# Patient Record
Sex: Female | Born: 1965 | Race: Black or African American | Hispanic: No | Marital: Married | Smoking: Never smoker
Health system: Southern US, Community
[De-identification: ages and names within clinical notes are randomized; demographics above are authoritative.]

## PROBLEM LIST (undated history)

## (undated) DIAGNOSIS — T7840XA Allergy, unspecified, initial encounter: Secondary | ICD-10-CM

## (undated) DIAGNOSIS — I456 Pre-excitation syndrome: Secondary | ICD-10-CM

## (undated) DIAGNOSIS — R002 Palpitations: Secondary | ICD-10-CM

## (undated) DIAGNOSIS — K219 Gastro-esophageal reflux disease without esophagitis: Secondary | ICD-10-CM

## (undated) DIAGNOSIS — D649 Anemia, unspecified: Secondary | ICD-10-CM

## (undated) HISTORY — PX: TUBAL LIGATION: SHX77

## (undated) HISTORY — DX: Pre-excitation syndrome: I45.6

## (undated) HISTORY — PX: LEEP: SHX91

## (undated) HISTORY — DX: Anemia, unspecified: D64.9

## (undated) HISTORY — DX: Palpitations: R00.2

## (undated) HISTORY — DX: Gastro-esophageal reflux disease without esophagitis: K21.9

## (undated) HISTORY — DX: Allergy, unspecified, initial encounter: T78.40XA

## (undated) HISTORY — PX: OTHER SURGICAL HISTORY: SHX169

---

## 1986-04-30 HISTORY — PX: APPENDECTOMY: SHX54

## 2001-03-11 ENCOUNTER — Ambulatory Visit (HOSPITAL_COMMUNITY): Admission: RE | Admit: 2001-03-11 | Discharge: 2001-03-11 | Payer: Self-pay | Admitting: *Deleted

## 2002-12-21 ENCOUNTER — Ambulatory Visit (HOSPITAL_COMMUNITY): Admission: RE | Admit: 2002-12-21 | Discharge: 2002-12-21 | Payer: Self-pay | Admitting: *Deleted

## 2003-02-05 ENCOUNTER — Ambulatory Visit (HOSPITAL_COMMUNITY): Admission: RE | Admit: 2003-02-05 | Discharge: 2003-02-05 | Payer: Self-pay | Admitting: Internal Medicine

## 2006-03-27 ENCOUNTER — Ambulatory Visit: Payer: Self-pay | Admitting: Internal Medicine

## 2006-05-20 ENCOUNTER — Ambulatory Visit: Payer: Self-pay

## 2006-05-27 ENCOUNTER — Ambulatory Visit: Payer: Self-pay

## 2012-11-06 ENCOUNTER — Encounter: Payer: Self-pay | Admitting: Internal Medicine

## 2012-11-11 ENCOUNTER — Encounter: Payer: Self-pay | Admitting: Internal Medicine

## 2012-11-11 ENCOUNTER — Encounter: Payer: Self-pay | Admitting: *Deleted

## 2012-11-11 ENCOUNTER — Ambulatory Visit (INDEPENDENT_AMBULATORY_CARE_PROVIDER_SITE_OTHER): Payer: 59 | Admitting: Internal Medicine

## 2012-11-11 VITALS — BP 104/70 | HR 73 | Ht 66.5 in | Wt 264.6 lb

## 2012-11-11 DIAGNOSIS — R002 Palpitations: Secondary | ICD-10-CM | POA: Insufficient documentation

## 2012-11-11 DIAGNOSIS — R0600 Dyspnea, unspecified: Secondary | ICD-10-CM

## 2012-11-11 DIAGNOSIS — I456 Pre-excitation syndrome: Secondary | ICD-10-CM

## 2012-11-11 DIAGNOSIS — R0602 Shortness of breath: Secondary | ICD-10-CM

## 2012-11-11 LAB — CBC WITH DIFFERENTIAL/PLATELET
Basophils Relative: 0.5 % (ref 0.0–3.0)
Eosinophils Absolute: 0.2 10*3/uL (ref 0.0–0.7)
HCT: 37.3 % (ref 36.0–46.0)
Hemoglobin: 12.2 g/dL (ref 12.0–15.0)
Lymphs Abs: 1.8 10*3/uL (ref 0.7–4.0)
MCHC: 32.6 g/dL (ref 30.0–36.0)
MCV: 79.3 fl (ref 78.0–100.0)
Monocytes Absolute: 0.5 10*3/uL (ref 0.1–1.0)
Neutro Abs: 5.1 10*3/uL (ref 1.4–7.7)
RBC: 4.71 Mil/uL (ref 3.87–5.11)

## 2012-11-11 LAB — BASIC METABOLIC PANEL
CO2: 26 mEq/L (ref 19–32)
Chloride: 107 mEq/L (ref 96–112)
Glucose, Bld: 103 mg/dL — ABNORMAL HIGH (ref 70–99)
Sodium: 139 mEq/L (ref 135–145)

## 2012-11-11 NOTE — Assessment & Plan Note (Signed)
The patient does have palpitations. She is a difficult historian however. It is not clear to me that her symptoms are caused by worsening SVT or perhaps atrial fibrillation. After additional evaluation, it is my anticipation that she will undergo  48 hour Holter monitoring. I have not restarted her antiarrhythmic medications at this time she was intolerant to these for the most part.

## 2012-11-11 NOTE — Patient Instructions (Signed)
Your physician recommends that you schedule a follow-up appointment in: 2 months with Dr Ladona Ridgel  Your physician has requested that you have an echocardiogram. Echocardiography is a painless test that uses sound waves to create images of your heart. It provides your doctor with information about the size and shape of your heart and how well your heart's chambers and valves are working. This procedure takes approximately one hour. There are no restrictions for this procedure.   Your physician recommends that you return for lab work today BMP/TSH/CBC

## 2012-11-11 NOTE — Assessment & Plan Note (Signed)
Along with dyspnea, she has fatigue and may legs. We will obtain blood work today including basic metabolic profile, a CBC,, and thyroid function studies.

## 2012-11-11 NOTE — Progress Notes (Signed)
HPI Ms. Tolen returns today for followup. She has not been seen in our arrhythmia clinic for 10 years. 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 Her pathway conduction was fairly poor and was not thought to be dangerous. Because of the location of the pathway and because of her relatively young age, it was deemed most appropriate not to proceed with catheter ablation. In the interim, she has been stable. Over the last several months however the patient has had fatigue, weakness, palpitations, and overall may legs. She notes some peripheral edema, PND, and orthopnea. She has tingling and numbness in her lower extremities, particularly when she sits for a long time. She has gained 40 pounds in the last 10 years. She denies frank syncope. She is on no medical therapy. She thinks she is going through menopause. Allergies  Allergen Reactions  . Penicillins     "Severe reaction as a child"     No current outpatient prescriptions on file.   No current facility-administered medications for this visit.     History reviewed. No pertinent past medical history.  ROS:   All systems reviewed and negative except as noted in the HPI.   History reviewed. No pertinent past surgical history.   No family history on file.   History   Social History  . Marital Status: Single    Spouse Name: N/A    Number of Children: N/A  . Years of Education: N/A   Occupational History  . Not on file.   Social History Main Topics  . Smoking status: Never Smoker   . Smokeless tobacco: Not on file  . Alcohol Use: 1.0 oz/week    2 drink(s) per week  . Drug Use: No  . Sexually Active: Yes -- Female partner(s)   Other Topics Concern  . Not on file   Social History Narrative  . No narrative on file     BP 104/70  Pulse 73  Ht 5' 6.5" (1.689 m)  Wt 264 lb 9.6 oz (120.022 kg)  BMI  42.07 kg/m2  Physical Exam:  Well appearing obese, middle-age woman, NAD HEENT: Unremarkable Neck:  7 cm JVD, no thyromegally Lungs:  Clear with no wheezes, rales, or rhonchi. HEART:  Regular rate rhythm, no murmurs, no rubs, no clicks Abd:  soft, obese, positive bowel sounds, no organomegally, no rebound, no guarding Ext:  2 plus pulses, no edema, no cyanosis, no clubbing Skin:  No rashes no nodules Neuro:  CN II through XII intact, motor grossly intact  EKG - normal sinus rhythm with sinus arrhythmia and occasional PVC. No manifest preexcitation is present on ECG today.   Assess/Plan:

## 2012-11-19 ENCOUNTER — Encounter: Payer: Self-pay | Admitting: Internal Medicine

## 2012-11-21 ENCOUNTER — Telehealth: Payer: Self-pay | Admitting: Internal Medicine

## 2012-11-21 NOTE — Telephone Encounter (Signed)
Follow up  Pt states she is returning a call regarding her labs.

## 2012-11-21 NOTE — Telephone Encounter (Signed)
Spoke with patient who states she had a call from [facility] yesterday and no message was left.  I advised patient that lab results have not been reviewed by Dr. Ladona Ridgel but I reported preliminary results.  Patient verbalized understanding and is aware of her ECHO appointment 7/29.

## 2012-11-25 ENCOUNTER — Ambulatory Visit (HOSPITAL_COMMUNITY): Payer: 59 | Attending: Internal Medicine

## 2012-11-25 DIAGNOSIS — R0989 Other specified symptoms and signs involving the circulatory and respiratory systems: Secondary | ICD-10-CM | POA: Insufficient documentation

## 2012-11-25 DIAGNOSIS — R002 Palpitations: Secondary | ICD-10-CM | POA: Insufficient documentation

## 2012-11-25 DIAGNOSIS — R0602 Shortness of breath: Secondary | ICD-10-CM

## 2012-11-25 DIAGNOSIS — I456 Pre-excitation syndrome: Secondary | ICD-10-CM

## 2012-11-25 DIAGNOSIS — R5381 Other malaise: Secondary | ICD-10-CM | POA: Insufficient documentation

## 2012-11-25 DIAGNOSIS — R609 Edema, unspecified: Secondary | ICD-10-CM | POA: Insufficient documentation

## 2012-11-25 DIAGNOSIS — R0609 Other forms of dyspnea: Secondary | ICD-10-CM | POA: Insufficient documentation

## 2012-11-25 NOTE — Progress Notes (Signed)
Echocardiogram performed.  

## 2012-11-28 NOTE — Telephone Encounter (Signed)
I have tried to call patient in regards to her normal echo.  Her number is d/c'd.  If she is still having palpitations we will order a monitor to assess prior to her follow up

## 2012-12-11 ENCOUNTER — Telehealth: Payer: Self-pay | Admitting: Internal Medicine

## 2012-12-11 NOTE — Telephone Encounter (Signed)
Patient is aware 

## 2012-12-11 NOTE — Telephone Encounter (Signed)
New Prob  Pt is calling wanting the results of her echo.

## 2013-01-15 ENCOUNTER — Ambulatory Visit (INDEPENDENT_AMBULATORY_CARE_PROVIDER_SITE_OTHER): Payer: 59 | Admitting: Internal Medicine

## 2013-01-15 ENCOUNTER — Encounter: Payer: Self-pay | Admitting: Internal Medicine

## 2013-01-15 VITALS — BP 120/80 | HR 66 | Ht 66.0 in | Wt 261.0 lb

## 2013-01-15 DIAGNOSIS — R002 Palpitations: Secondary | ICD-10-CM

## 2013-01-15 MED ORDER — FLECAINIDE ACETATE 150 MG PO TABS: 150.0000 mg | ORAL_TABLET | ORAL | Status: DC | PRN

## 2013-01-15 NOTE — Patient Instructions (Addendum)
Your physician has recommended you make the following change in your medication:   1. Start Flecainide 150 mg as needed for Palpitations. If Palpitations continue Repeat once in 24hrs  Your physician recommends that you schedule a follow-up appointment in: 3 months with Dr. Ladona Ridgel

## 2013-01-15 NOTE — Progress Notes (Signed)
HPI Paige Oneill returns today for followup. 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 At the time of her EP study 10 years ago, we decided not to proceed with catheter ablation out of concerns for developing complete heart block. She did well for many years and then she re-presented several months ago with increased palpitations. Since then, the patient has been stable but does continue to have palpitations though her symptoms have improved. She has tried a reduced caffeine in her diet. We initially discussed having her wear a cardiac monitor, but because her symptoms are so infrequent and variable, it was deemed worth waiting. Allergies  Allergen Reactions  . Penicillins     "Severe reaction as a child"     Current Outpatient Prescriptions  Medication Sig Dispense Refill  . flecainide (TAMBOCOR) 150 MG tablet Take 1 tablet (150 mg total) by mouth as needed. 1 tablet as needed for Palpitation. If Palpitations continue  Repeat once in 24hrs.  30 tablet  1   No current facility-administered medications for this visit.     History reviewed. No pertinent past medical history.  ROS:   All systems reviewed and negative except as noted in the HPI.   History reviewed. No pertinent past surgical history.   No family history on file.   History   Social History  . Marital Status: Single    Spouse Name: N/A    Number of Children: N/A  . Years of Education: N/A   Occupational History  . Not on file.   Social History Main Topics  . Smoking status: Never Smoker   . Smokeless tobacco: Not on file  . Alcohol Use: 1.0 oz/week    2 drink(s) per week  . Drug Use: No  . Sexual Activity: Yes    Partners: Male   Other Topics Concern  . Not on file   Social History Narrative  . No narrative on file     BP 120/80  Pulse 66  Ht 5\' 6"  (1.676 m)  Wt 261 lb (118.389 kg)  BMI  42.15 kg/m2  Physical Exam:  Obese but Well appearing middle-age woman,NAD HEENT: Unremarkable Neck:  6 cm JVD, no thyromegally Back:  No CVA tenderness Lungs:  Clear with no wheezes, rales, or rhonchi. HEART:  Regular rate rhythm, no murmurs, no rubs, no clicks Abd:  soft, positive bowel sounds, no organomegally, no rebound, no guarding Ext:  2 plus pulses, no edema, no cyanosis, no clubbing Skin:  No rashes no nodules Neuro:  CN II through XII intact, motor grossly intact   Assess/Plan:

## 2013-01-15 NOTE — Assessment & Plan Note (Signed)
We discussed the various treatment options in detail today. I've recommended that she try flecainide 150 mg taken as needed, on days that she is having increased palpitations. If she requires taking this medication several times a week, she is instructed to call us. Otherwise all see her back in several months.

## 2013-03-18 ENCOUNTER — Ambulatory Visit (INDEPENDENT_AMBULATORY_CARE_PROVIDER_SITE_OTHER): Payer: 59 | Admitting: Internal Medicine

## 2013-03-18 ENCOUNTER — Encounter: Payer: Self-pay | Admitting: Internal Medicine

## 2013-03-18 ENCOUNTER — Encounter: Payer: Self-pay | Admitting: *Deleted

## 2013-03-18 VITALS — BP 139/91 | HR 62 | Ht 66.5 in | Wt 263.0 lb

## 2013-03-18 DIAGNOSIS — I456 Pre-excitation syndrome: Secondary | ICD-10-CM

## 2013-03-18 DIAGNOSIS — R002 Palpitations: Secondary | ICD-10-CM

## 2013-03-18 NOTE — Patient Instructions (Signed)
Your physician wants you to follow-up in: 6 months with Dr. Taylor. You will receive a reminder letter in the mail two months in advance. If you don't receive a letter, please call our office to schedule the follow-up appointment.  Your physician recommends that you continue on your current medications as directed. Please refer to the Current Medication list given to you today.  

## 2013-03-18 NOTE — Assessment & Plan Note (Signed)
Her symptoms are fairly well controlled. She is taking flecainide as needed about twice a week. We discussed taking this medication more frequently as her symptoms necessitate.

## 2013-03-18 NOTE — Progress Notes (Signed)
HPI Paige Oneill returns today for followup. 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 At the time of her EP study 10 years ago, we decided not to proceed with catheter ablation out of concerns for developing complete heart block. She did well for many years and then she re-presented several months ago with increased palpitations. Since then, the patient has been stable but does continue to have palpitations though her symptoms have improved on flecainide. She has tried a reduced caffeine in her diet. She is frustrated about feeling tired and gaining weight. She is active at work but typically does not do much exercise. Allergies  Allergen Reactions  . Penicillins     "Severe reaction as a child"     Current Outpatient Prescriptions  Medication Sig Dispense Refill  . flecainide (TAMBOCOR) 150 MG tablet Take 1 tablet (150 mg total) by mouth as needed. 1 tablet as needed for Palpitation. If Palpitations continue  Repeat once in 24hrs.  30 tablet  1   No current facility-administered medications for this visit.     History reviewed. No pertinent past medical history.  ROS:   All systems reviewed and negative except as noted in the HPI.   History reviewed. No pertinent past surgical history.   No family history on file.   History   Social History  . Marital Status: Single    Spouse Name: N/A    Number of Children: N/A  . Years of Education: N/A   Occupational History  . Not on file.   Social History Main Topics  . Smoking status: Never Smoker   . Smokeless tobacco: Not on file  . Alcohol Use: 1.0 oz/week    2 drink(s) per week  . Drug Use: No  . Sexual Activity: Yes    Partners: Male   Other Topics Concern  . Not on file   Social History Narrative  . No narrative on file     BP 139/91  Pulse 62  Ht 5' 6.5" (1.689 m)  Wt 263 lb (119.296 kg)  BMI 41.82  kg/m2  Physical Exam:  Obese but Well appearing middle-age woman,NAD HEENT: Unremarkable Neck:  6 cm JVD, no thyromegally Back:  No CVA tenderness Lungs:  Clear with no wheezes, rales, or rhonchi. HEART:  Regular rate rhythm, no murmurs, no rubs, no clicks Abd:  soft, positive bowel sounds, no organomegally, no rebound, no guarding Ext:  2 plus pulses, no edema, no cyanosis, no clubbing Skin:  No rashes no nodules Neuro:  CN II through XII intact, motor grossly intact   Assess/Plan:

## 2013-03-18 NOTE — Assessment & Plan Note (Signed)
We discussed the importance of weight loss, specifically exercising more. She will try to be more active.

## 2013-09-14 ENCOUNTER — Ambulatory Visit: Payer: 59 | Admitting: Internal Medicine

## 2013-09-24 ENCOUNTER — Encounter: Payer: Self-pay | Admitting: Internal Medicine

## 2014-08-24 ENCOUNTER — Ambulatory Visit (INDEPENDENT_AMBULATORY_CARE_PROVIDER_SITE_OTHER): Payer: 59 | Admitting: Internal Medicine

## 2014-08-24 ENCOUNTER — Encounter: Payer: Self-pay | Admitting: *Deleted

## 2014-08-24 ENCOUNTER — Encounter: Payer: Self-pay | Admitting: Internal Medicine

## 2014-08-24 VITALS — BP 130/84 | HR 70 | Ht 66.5 in | Wt 262.8 lb

## 2014-08-24 DIAGNOSIS — I456 Pre-excitation syndrome: Secondary | ICD-10-CM

## 2014-08-24 NOTE — Assessment & Plan Note (Signed)
She is frustrated by her inability to lose weight. I've encouraged the patient to increase her physical activity and eat less.

## 2014-08-24 NOTE — Patient Instructions (Signed)

## 2014-08-24 NOTE — Assessment & Plan Note (Signed)
She is minimally symptomatic and has no evidence of ventricular preexcitation on her ECG today. She will continue her current medical therapy. She is encouraged to maintain a low caffeine, low alcohol diet.

## 2014-08-24 NOTE — Progress Notes (Signed)
HPI [name] returns today for followup. 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 At the time of her EP study 12 years ago, we decided not to proceed with catheter ablation out of concerns for developing complete heart block. She did well for many years and then she re-presented over a year ago with increased palpitations. Since then, the patient has been stable but does continue to have palpitations though her symptoms have improved on flecainide. She has tried a reduced caffeine in her diet. She is frustrated about feeling tired and gaining weight. She has had minimal palpitations, and is only taking her flecainide on an as-needed basis, typically once or twice a month. Allergies  Allergen Reactions  . Penicillins     "Severe reaction as a child"     Current Outpatient Prescriptions  Medication Sig Dispense Refill  . flecainide (TAMBOCOR) 150 MG tablet Take 150 mg by mouth daily as needed (Palpitations). Can repeat once in 24 hours if palpitations continue     No current facility-administered medications for this visit.     No past medical history on file.  ROS:   All systems reviewed and negative except as noted in the HPI.   No past surgical history on file.   Family History  Problem Relation Age of Onset  . Hypertension Mother   . Diabetes Mother   . Aneurysm Mother   . Cancer Mother   . Cancer Maternal Grandmother   . Heart murmur Daughter      History   Social History  . Marital Status: Single    Spouse Name: N/A  . Number of Children: N/A  . Years of Education: N/A   Occupational History  . Not on file.   Social History Main Topics  . Smoking status: Never Smoker   . Smokeless tobacco: Not on file  . Alcohol Use: 1.0 oz/week    2 Standard drinks or equivalent per week  . Drug Use: No  . Sexual Activity:    Partners: Male   Other Topics Concern  .  Not on file   Social History Narrative     BP 130/84 mmHg  Pulse 70  Ht 5' 6.5" (1.689 m)  Wt 262 lb 12.8 oz (119.205 kg)  BMI 41.79 kg/m2  Physical Exam:  Obese but Well appearing middle-age woman,NAD HEENT: Unremarkable Neck:  6 cm JVD, no thyromegally Back:  No CVA tenderness Lungs:  Clear with no wheezes, rales, or rhonchi. HEART:  Regular rate rhythm, no murmurs, no rubs, no clicks Abd:  soft, positive bowel sounds, no organomegally, no rebound, no guarding Ext:  2 plus pulses, no edema, no cyanosis, no clubbing Skin:  No rashes no nodules Neuro:  CN II through XII intact, motor grossly intact  ECG - normal sinus rhythm with normal axis and intervals. There was no ventricular preexcitation. Assess/Plan:

## 2016-11-16 ENCOUNTER — Encounter: Payer: Self-pay | Admitting: Gastroenterology

## 2016-11-29 ENCOUNTER — Telehealth: Payer: Self-pay

## 2016-11-29 NOTE — Telephone Encounter (Signed)
Mother, Paige Oneill came in to see Paige Oneill on 11-29-16. She was under the impression that she and her daughter, Paige Oneill were scheduled for colons on the same day: Sept 21, 2018.  Her mother had not been scheduled prior to her appt with Amy b/c she is on anti coagulant, Plavix.  We scheduled her and her mother, Paige Oneill, with Dr. Russella DarStark on 02-11-17. I cancelled Paige Oneill's colon on 01/18/17 and called and let her know about the new date.  She confirmed understanding. She is scheduled for a Previsit on 01-04-17.

## 2017-01-04 ENCOUNTER — Ambulatory Visit (AMBULATORY_SURGERY_CENTER): Payer: Self-pay | Admitting: *Deleted

## 2017-01-04 VITALS — Ht 66.5 in | Wt 270.0 lb

## 2017-01-04 DIAGNOSIS — Z800 Family history of malignant neoplasm of digestive organs: Secondary | ICD-10-CM

## 2017-01-04 MED ORDER — NA SULFATE-K SULFATE-MG SULF 17.5-3.13-1.6 GM/177ML PO SOLN: 1.0000 | Freq: Once | ORAL | 0 refills | Status: AC

## 2017-01-04 NOTE — Progress Notes (Signed)
No egg or soy allergy known to patient  No issues with past sedation with any surgeries  or procedures, no intubation problems  No diet pills per patient No home 02 use per patient  No blood thinners per patient  Pt denies issues with constipation  No A fib or A flutter  EMMI video sent to pt's e mail pt declined   

## 2017-01-18 ENCOUNTER — Encounter: Payer: 59 | Admitting: Gastroenterology

## 2017-01-28 ENCOUNTER — Ambulatory Visit (INDEPENDENT_AMBULATORY_CARE_PROVIDER_SITE_OTHER): Payer: 59

## 2017-01-28 ENCOUNTER — Ambulatory Visit (INDEPENDENT_AMBULATORY_CARE_PROVIDER_SITE_OTHER): Payer: 59 | Admitting: Orthopaedic Surgery

## 2017-01-28 ENCOUNTER — Encounter: Payer: Self-pay | Admitting: Gastroenterology

## 2017-01-28 DIAGNOSIS — M25562 Pain in left knee: Secondary | ICD-10-CM

## 2017-01-28 DIAGNOSIS — M25572 Pain in left ankle and joints of left foot: Secondary | ICD-10-CM

## 2017-01-28 DIAGNOSIS — G8929 Other chronic pain: Secondary | ICD-10-CM | POA: Diagnosis not present

## 2017-01-28 MED ORDER — METHYLPREDNISOLONE 4 MG PO TABS: ORAL_TABLET | ORAL | 0 refills | Status: DC

## 2017-01-28 MED ORDER — MELOXICAM 7.5 MG PO TABS: 7.5000 mg | ORAL_TABLET | Freq: Two times a day (BID) | ORAL | 3 refills | Status: DC | PRN

## 2017-01-28 NOTE — Progress Notes (Signed)
Office Visit Note   Patient: Paige Oneill           Date of Birth: 15-Mar-1966           MRN: [id number] Visit Date: 01/28/2017              Requested by: No referring provider defined for this encounter. PCP: Patient, No Pcp Per   Assessment & Plan: Visit Diagnoses:  1. Chronic pain of left knee   2. Pain in left ankle and joints of left foot     Plan: I do feel her symptoms of her foot are related more plantar fasciitis and this may affect her knee the way she walks. I'll give her a note recommending no steal toed shoes or boots. We'll put her on a steroid taper as well as some topical anti-inflammatories and activity modification. I recommended heel inserts and formal physical therapy they could help with plantar fasciitis as well as her knee. I talked about quad training exercises and weight loss as well. Over this combination of activities will help her symptoms resolve. If not we may consider steroid injection. All questions concerns were answered and addressed. I'll have her come back and see [place] in 4 weeks.  Follow-Up Instructions: Return in about 4 weeks (around 02/25/2017).   Orders:  Orders Placed This Encounter  Procedures  . XR Knee 1-2 Views Left  . XR Foot Complete Left   Meds ordered this encounter  Medications  . methylPREDNISolone (MEDROL) 4 MG tablet    Sig: Medrol dose pack. Take as instructed    Dispense:  21 tablet    Refill:  0  . meloxicam (MOBIC) 7.5 MG tablet    Sig: Take 1 tablet (7.5 mg total) by mouth 2 (two) times daily between meals as needed for pain.    Dispense:  60 tablet    Refill:  3      Procedures: No procedures performed   Clinical Data: No additional findings.   Subjective: No chief complaint on file. The patient comes in with chief complaints of both left knee pain and left foot pain. Left foot is been worse and she points to the arch and the plantar aspect of her foot. 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 She is on her feet on concrete all day. She says the first step she takes in the morning is very painful and she's been sitting for a while and gets up it is painful. At times she feels like her knees giving way on her but she denies any locking catching. She gets pain going up and down stairs. She is moderately obese individual. She has not tried any significant inflammatory does not been through any therapy either. She denies any recent injuries.  HPI  Review of Systems She currently denies any headache, chest pain, shortness of breath, fever, chills, nausea, vomiting.  Objective: Vital Signs: There were no vitals taken for this visit.  Physical Exam She is alert or 3 and in no acute distress Ortho Exam Examination of her left knee shows fluid range of motion of that left knee. The patella tracks well. She has negative McMurray's and Lachman's exam. There is no effusion. Examination of her left foot shows pain over the heel over the plantar fascia consistent with plantar fasciitis. She's got good range of motion of her foot and its neurovascular intact. Specialty Comments:  No specialty comments  available.  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  Xr Knee 1-2 Views Left  Result Date: 01/28/2017 2 views left knee appear normal with no acute findings.    PMFS History: Patient Active Problem List   Diagnosis Date Noted  . Morbid obesity (HCC) 03/18/2013  . 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 Dyspnea 11/11/2012  . Palpitations 11/11/2012   Past Medical History:  Diagnosis Date  . Allergy   . Anemia   . GERD (gastroesophageal reflux disease)    occ  . Palpitations   . Wolff-Parkinson-White (WPW) syndrome     Family History  Problem Relation Age of Onset  . Hypertension Mother   . Diabetes  Mother   . Aneurysm Mother   . Cancer Mother   . Colon cancer Maternal Grandmother   . Heart murmur Daughter   . Colon cancer Brother 52       dx'd 6 months ago as of 01-04-17   . Liver cancer Brother        mets from colon   . Lung cancer Brother        mets from colon   . Thyroid cancer Maternal Grandfather   . Colon cancer Other   . Colon polyps Neg Hx   . Esophageal cancer Neg Hx     Past Surgical History:  Procedure Laterality Date  . APPENDECTOMY  1988  . LEEP    . NSVD     x3  . TUBAL LIGATION     Social History   Occupational History  . Not on file.   Social History Main Topics  . Smoking status: Never Smoker  . Smokeless tobacco: Never Used  . Alcohol use 1.0 oz/week    2 Standard drinks or equivalent per week     Comment: socially  . Drug use: No  . Sexual activity: Yes    Partners: Male

## 2017-02-11 ENCOUNTER — Encounter: Payer: Self-pay | Admitting: Gastroenterology

## 2017-02-11 ENCOUNTER — Ambulatory Visit (AMBULATORY_SURGERY_CENTER): Payer: 59 | Admitting: Gastroenterology

## 2017-02-11 VITALS — BP 126/62 | HR 62 | Temp 97.1°F | Resp 21 | Ht 66.0 in | Wt 270.0 lb

## 2017-02-11 DIAGNOSIS — Z800 Family history of malignant neoplasm of digestive organs: Secondary | ICD-10-CM | POA: Diagnosis not present

## 2017-02-11 DIAGNOSIS — Z1212 Encounter for screening for malignant neoplasm of rectum: Secondary | ICD-10-CM | POA: Diagnosis not present

## 2017-02-11 DIAGNOSIS — Z1211 Encounter for screening for malignant neoplasm of colon: Secondary | ICD-10-CM

## 2017-02-11 MED ORDER — SODIUM CHLORIDE 0.9 % IV SOLN: 500.0000 mL | INTRAVENOUS | Status: AC

## 2017-02-11 NOTE — Op Note (Addendum)
[facility] Patient Name: Paige Oneill Procedure Date: 02/11/2017 11:33 AM MRN: [id number] Endoscopist: Meryl Dare , MD Age: 51 Referring MD:  Date of Birth: Jun 04, 1965 Gender: Female Account #: [id number] Procedure:                Colonoscopy Indications:              Screening in patient at increased risk: Family                            history of 1st-degree relative with colorectal                            cancer before age 54 years Medicines:                Monitored Anesthesia Care Procedure:                Pre-Anesthesia Assessment:                           - Prior to the procedure, a History and Physical                            was performed, and patient medications and                            allergies were reviewed. The patient's tolerance of                            previous anesthesia was also reviewed. The risks                            and benefits of the procedure and the sedation                            options and risks were discussed with the patient.                            All questions were answered, and informed consent                            was obtained. Prior Anticoagulants: The patient has                            taken no previous anticoagulant or antiplatelet                            agents. ASA Grade Assessment: II - A patient with                            mild systemic disease. After reviewing the risks                            and benefits, the patient was deemed in  satisfactory condition to undergo the procedure.                           After obtaining informed consent, the colonoscope                            was passed under direct vision. Throughout the                            procedure, the patient's blood pressure, pulse, and                            oxygen saturations were monitored continuously. The                            Colonoscope was introduced through  the anus and                            advanced to the the cecum, identified by                            appendiceal orifice and ileocecal valve. The                            ileocecal valve, appendiceal orifice, and rectum                            were photographed. The quality of the bowel                            preparation was good. The colonoscopy was performed                            without difficulty. The patient tolerated the                            procedure well. Scope In: 11:43:26 AM Scope Out: 11:53:30 AM Scope Withdrawal Time: 0 hours 6 minutes 56 seconds  Total Procedure Duration: 0 hours 10 minutes 4 seconds  Findings:                 The perianal and digital rectal examinations were                            normal.                           A single small angiodysplastic lesion without                            bleeding was found in the cecum.                           Multiple medium-mouthed diverticula were found in  the left colon. There was no evidence of                            diverticular bleeding.                           The exam was otherwise without abnormality on                            direct and retroflexion views. Complications:            No immediate complications. Estimated blood loss:                            None. Estimated Blood Loss:     Estimated blood loss: none. Impression:               - A single small non-bleeding colonic                            angiodysplastic lesion.                           - Mild diverticulosis in the left colon. There was                            no evidence of diverticular bleeding.                           - The examination was otherwise normal on direct                            and retroflexion views.                           - No specimens collected. Recommendation:           - Repeat colonoscopy in 5 years for screening                             purposes.                           - Patient has a contact number available for                            emergencies. The signs and symptoms of potential                            delayed complications were discussed with the                            patient. Return to normal activities tomorrow.                            Written discharge instructions were provided to the  patient.                           - Continue present medications.                           - High fiber diet indefinitely. Meryl Dare, MD 02/11/2017 11:58:46 AM This report has been signed electronically.

## 2017-02-11 NOTE — Progress Notes (Signed)
Report given to PACU, vss 

## 2017-02-11 NOTE — Patient Instructions (Signed)
Discharge instructions given. Handout on diverticulosis. Resume previous medications. YOU HAD AN ENDOSCOPIC PROCEDURE TODAY AT THE [facility]:   Refer to the procedure report that was given to you for any specific questions about what was found during the examination.  If the procedure report does not answer your questions, please call your gastroenterologist to clarify.  If you requested that your care partner not be given the details of your procedure findings, then the procedure report has been included in a sealed envelope for you to review at your convenience later.  YOU SHOULD EXPECT: Some feelings of bloating in the abdomen. Passage of more gas than usual.  Walking can help get rid of the air that was put into your GI tract during the procedure and reduce the bloating. If you had a lower endoscopy (such as a colonoscopy or flexible sigmoidoscopy) you may notice spotting of blood in your stool or on the toilet paper. If you underwent a bowel prep for your procedure, you may not have a normal bowel movement for a few days.  Please Note:  You might notice some irritation and congestion in your nose or some drainage.  This is from the oxygen used during your procedure.  There is no need for concern and it should clear up in a day or so.  SYMPTOMS TO REPORT IMMEDIATELY:   Following lower endoscopy (colonoscopy or flexible sigmoidoscopy):  Excessive amounts of blood in the stool  Significant tenderness or worsening of abdominal pains  Swelling of the abdomen that is new, acute  Fever of 100F or higher   For urgent or emergent issues, a gastroenterologist can be reached at any hour by calling [phone].   DIET:  We do recommend a small meal at first, but then you may proceed to your regular diet.  Drink plenty of fluids but you should avoid alcoholic beverages for 24 hours.  ACTIVITY:  You should plan to take it easy for the rest of today and you should NOT DRIVE or use  heavy machinery until tomorrow (because of the sedation medicines used during the test).    FOLLOW UP: Our staff will call the number listed on your records the next business day following your procedure to check on you and address any questions or concerns that you may have regarding the information given to you following your procedure. If we do not reach you, we will leave a message.  However, if you are feeling well and you are not experiencing any problems, there is no need to return our call.  We will assume that you have returned to your regular daily activities without incident.  If any biopsies were taken you will be contacted by phone or by letter within the next 1-3 weeks.  Please call us at [phone] if you have not heard about the biopsies in 3 weeks.    SIGNATURES/CONFIDENTIALITY: You and/or your care partner have signed paperwork which will be entered into your electronic medical record.  These signatures attest to the fact that that the information above on your After Visit Summary has been reviewed and is understood.  Full responsibility of the confidentiality of this discharge information lies with you and/or your care-partner. 

## 2017-02-12 ENCOUNTER — Telehealth: Payer: Self-pay | Admitting: *Deleted

## 2017-02-12 NOTE — Telephone Encounter (Signed)
  Follow up Call-  Call back number 02/11/2017  Post procedure Call Back phone  # [phone]  Permission to leave phone message Yes  Some recent data might be hidden     Patient questions:  Do you have a fever, pain , or abdominal swelling? No. Pain Score  0 *  Have you tolerated food without any problems? Yes.    Have you been able to return to your normal activities? Yes.    Do you have any questions about your discharge instructions: Diet   No. Medications  No. Follow up visit  No.  Do you have questions or concerns about your Care? No.  Actions: * If pain score is 4 or above: No action needed, pain <4.  Pt. Did after procedure but was having trouble with her job not wanting except our doctors note.

## 2017-02-27 ENCOUNTER — Ambulatory Visit (INDEPENDENT_AMBULATORY_CARE_PROVIDER_SITE_OTHER): Payer: 59 | Admitting: Orthopaedic Surgery

## 2017-02-27 DIAGNOSIS — M722 Plantar fascial fibromatosis: Secondary | ICD-10-CM | POA: Insufficient documentation

## 2017-02-27 NOTE — Progress Notes (Signed)
The patient is well-known to us.  She is following up after steroid injection her left knee as well as following up for left foot plantar fasciitis.  She says her knee is doing fairly well and has no issues at all.  Her left foot is still painful to her.  She has been trying given exercises to help.  Her first step in the morning is terrible for her.  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  She is the only thing that helped in the past is rest from work.  She still reluctant to try the injection.  On examination of her left knee she has no effusion with full range of motion of the left knee and no pain at all.  Her left foot she has significant pain over the plantar fascia with otherwise normal-appearing foot normal contours.  She is neurovascularly intact.  At this point I will give her a note to keep her out of work completely until 19 November.  Offered a steroid injection again she declined this.  She will consider we will see her back in a month if things are not improving.  There are thing to consider would be formal physical therapy as well.

## 2017-02-28 ENCOUNTER — Telehealth (INDEPENDENT_AMBULATORY_CARE_PROVIDER_SITE_OTHER): Payer: Self-pay | Admitting: Orthopaedic Surgery

## 2017-02-28 NOTE — Telephone Encounter (Signed)
Paige Oneill,Paige Oneill [birth year] [phone]   Please call pt when complete  Pt called this morning wanting to know if her paperwork is complete. Im not sure what type of paperwork it is.pt stated it needed to be signed and faxed.

## 2017-02-28 NOTE — Telephone Encounter (Signed)
This is the one I gave you

## 2017-03-01 NOTE — Telephone Encounter (Signed)
Fyi.Marland Kitchen.Marland Kitchen.Paperwork was completed and faxed in. I called patient and advised.   Patient is concerned about swelling in her foot and ankle and asked about diuretic. I advised that she should be elevating above her heart and resting the ankle. She is concerned that she should come in sooner.

## 2017-03-04 NOTE — Telephone Encounter (Signed)
See below

## 2017-03-04 NOTE — Telephone Encounter (Signed)
Swelling symptoms this does not usually go away with a diuretic.  If she needs a diuretic this would have to be provided by her primary care physician since does have an effect on blood pressure as well.  Only thing she can do for plantar fasciitis is ice anti-inflammatories and elevation and staying off her foot.  She can avoid open back shoes.  No other thing I would do a steroid injection which she deferred to the last visit.

## 2017-03-04 NOTE — Telephone Encounter (Signed)
Patient aware of below message her form is completed at front desk

## 2017-03-14 ENCOUNTER — Telehealth (INDEPENDENT_AMBULATORY_CARE_PROVIDER_SITE_OTHER): Payer: Self-pay | Admitting: Orthopaedic Surgery

## 2017-03-14 NOTE — Telephone Encounter (Signed)
Patient called advised she will need a note to return back to work on 03/18/17. Patient said she is not seeing Dr Magnus IvanBlackman again until 03/27/17. Patient asked if Dr Magnus IvanBlackman can extend her returning back to work after she sees him on the 28th. The number to contact patient is [phone]

## 2017-03-15 NOTE — Telephone Encounter (Signed)
New note taking patient out of work until 03/22/2017 entered. Patient advised. Note at front for pick up.

## 2017-03-15 NOTE — Telephone Encounter (Signed)
That will be fine. 

## 2017-03-15 NOTE — Telephone Encounter (Signed)
I spoke with patient. She would like for work note to be extended and have her return to work on 03/22/2017. I advised that Dr. Magnus IvanBlackman is in surgery and would have to authorize the extension. I told the patient I would send a message, however, he may not answer it until Monday. She has to have a note irregardless to return on 03/18/2017 which is what is stated in her last office note. I told her that I could do a note for that since it is in the chart. Patient would like to pick up that note to have just in case. She would like a call back if Dr. Magnus IvanBlackman will extend note for out of work and have her return on 03/22/2017.

## 2017-03-15 NOTE — Telephone Encounter (Signed)
Please see below.  Patient requests extension on work note that had her return to work on 11/19. She would like to return to work 11/23. Please advise.

## 2017-03-27 ENCOUNTER — Ambulatory Visit (INDEPENDENT_AMBULATORY_CARE_PROVIDER_SITE_OTHER): Payer: 59 | Admitting: Orthopaedic Surgery

## 2017-03-27 ENCOUNTER — Encounter (INDEPENDENT_AMBULATORY_CARE_PROVIDER_SITE_OTHER): Payer: Self-pay | Admitting: Orthopaedic Surgery

## 2017-03-27 DIAGNOSIS — M722 Plantar fascial fibromatosis: Secondary | ICD-10-CM

## 2017-03-27 DIAGNOSIS — G8929 Other chronic pain: Secondary | ICD-10-CM | POA: Diagnosis not present

## 2017-03-27 DIAGNOSIS — M25562 Pain in left knee: Secondary | ICD-10-CM | POA: Diagnosis not present

## 2017-03-27 MED ORDER — LIDOCAINE HCL 1 % IJ SOLN
1.0000 mL | INTRAMUSCULAR | Status: AC | PRN
  Administered 2017-03-27: 1 mL

## 2017-03-27 MED ORDER — METHYLPREDNISOLONE ACETATE 40 MG/ML IJ SUSP
40.0000 mg | INTRAMUSCULAR | Status: AC | PRN
  Administered 2017-03-27: 40 mg

## 2017-03-27 NOTE — Progress Notes (Signed)
Office Visit Note   Patient: Paige Oneill           Date of Birth: 1966-03-12           MRN: [id number] Visit Date: 03/27/2017              Requested by: No referring provider defined for this encounter. PCP: Patient, No Pcp Per   Assessment & Plan: Visit Diagnoses:  1. Plantar fasciitis of left foot   2. Chronic pain of left knee     Plan: At this point I am recommending further stretching of her foot but also a steroid injection of the plantar fashion.  We talked about the reasoning behind this injection as well as the risk and benefits of this.  At this point she does wish to try this.  She tolerated it well.  All questions and concerns were answered and addressed.  We will have her follow-up as needed at this point.  Follow-Up Instructions: Return if symptoms worsen or fail to improve.   Orders:  Orders Placed This Encounter  Procedures  . Foot Inj   No orders of the defined types were placed in this encounter.     Procedures: Foot Inj Date/Time: 03/27/2017 9:43 AM Performed by: Kathryne HitchBlackman, Valentino Saavedra Y, MD Authorized by: Kathryne HitchBlackman, Fayne Mcguffee Y, MD   Condition: Plantar Fasciitis   Location: left plantar fascia muscle   Medications:  1 mL lidocaine 1 %; 40 mg methylPREDNISolone acetate 40 MG/ML     Clinical Data: No additional findings.   Subjective: No chief complaint on file. Patient comes in today with chief complaint of left foot pain in the arch of her foot and the plantar fascial area.  We will have her out of work for this before and had her in a cam walking boot.  The pain is still quite severe at times.  Her left knee pain that is been problematic in the past is completely resolved.  She denies any numbness and tingling in her foot.  She says rest and stretching does help.  She is even had massage therapy to this area but the pain does seem like it is getting worse in some aspects.  However is not waking up at night like it used to  HPI  Review of  Systems She currently denies any headache, chest pain, shortness of breath, fever, chills, nausea, vomiting.  Objective: Vital Signs: LMP  (LMP Unknown)   Physical Exam She is alert and oriented x3 and in no acute distress examination of her left knee is normal.  Examination of the left foot Ortho Exam Shows severe pain in the arch of her foot at the plantar fascial.  The remainder of the foot exam is normal.  She does not have flatfoot deformity.  She is neurovascular intact.  The posterior tibial tendon is functioning appropriately. Specialty Comments:  No specialty comments available.  Imaging: No results found.   PMFS History: Patient Active Problem List   Diagnosis Date Noted  . Chronic pain of left knee 03/27/2017  . Plantar fasciitis of left foot 02/27/2017  . Morbid obesity (HCC) 03/18/2013  . 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 Dyspnea 11/11/2012  . Palpitations 11/11/2012   Past Medical History:  Diagnosis Date  . Allergy   . Anemia   . GERD (gastroesophageal reflux disease)    occ  . Palpitations   . Wolff-Parkinson-White (WPW) syndrome     Family History  Problem Relation Age of Onset  . Hypertension  Mother   . Diabetes Mother   . Aneurysm Mother   . Cancer Mother   . Colon cancer Maternal Grandmother   . Heart murmur Daughter   . Colon cancer Brother 52       dx'd 6 months ago as of 01-04-17   . Liver cancer Brother        mets from colon   . Lung cancer Brother        mets from colon   . Thyroid cancer Maternal Grandfather   . Colon cancer Other   . Colon polyps Neg Hx   . Esophageal cancer Neg Hx     Past Surgical History:  Procedure Laterality Date  . APPENDECTOMY  1988  . LEEP    . NSVD     x3  . TUBAL LIGATION     Social History   Occupational History  . Not on file  Tobacco Use  . Smoking status: Never Smoker  . Smokeless tobacco: Never Used  Substance and Sexual Activity  . Alcohol use: Yes    Alcohol/week: 1.0 oz    Types: 2 Standard  drinks or equivalent per week    Comment: socially  . Drug use: No  . Sexual activity: Yes    Partners: Male

## 2017-04-29 ENCOUNTER — Other Ambulatory Visit (INDEPENDENT_AMBULATORY_CARE_PROVIDER_SITE_OTHER): Payer: Self-pay

## 2017-04-29 MED ORDER — MELOXICAM 7.5 MG PO TABS: 7.5000 mg | ORAL_TABLET | Freq: Two times a day (BID) | ORAL | 0 refills | Status: DC | PRN

## 2019-02-11 ENCOUNTER — Emergency Department (HOSPITAL_COMMUNITY): Payer: 59

## 2019-02-11 ENCOUNTER — Emergency Department (HOSPITAL_COMMUNITY)
Admission: EM | Admit: 2019-02-11 | Discharge: 2019-02-11 | Disposition: A | Discharge status: Home / Self Care | Payer: 59 | Attending: Emergency Medicine | Admitting: Emergency Medicine

## 2019-02-11 ENCOUNTER — Encounter (HOSPITAL_COMMUNITY): Payer: Self-pay | Admitting: Emergency Medicine

## 2019-02-11 ENCOUNTER — Other Ambulatory Visit: Payer: Self-pay

## 2019-02-11 DIAGNOSIS — R0602 Shortness of breath: Secondary | ICD-10-CM | POA: Insufficient documentation

## 2019-02-11 DIAGNOSIS — R202 Paresthesia of skin: Secondary | ICD-10-CM | POA: Insufficient documentation

## 2019-02-11 DIAGNOSIS — R072 Precordial pain: Secondary | ICD-10-CM | POA: Diagnosis not present

## 2019-02-11 DIAGNOSIS — M25512 Pain in left shoulder: Secondary | ICD-10-CM | POA: Diagnosis not present

## 2019-02-11 DIAGNOSIS — M546 Pain in thoracic spine: Secondary | ICD-10-CM | POA: Insufficient documentation

## 2019-02-11 DIAGNOSIS — R1013 Epigastric pain: Secondary | ICD-10-CM | POA: Insufficient documentation

## 2019-02-11 DIAGNOSIS — R079 Chest pain, unspecified: Secondary | ICD-10-CM | POA: Diagnosis present

## 2019-02-11 LAB — BASIC METABOLIC PANEL
Anion gap: 12 (ref 5–15)
BUN: 6 mg/dL (ref 6–20)
CO2: 23 mmol/L (ref 22–32)
Calcium: 8.8 mg/dL — ABNORMAL LOW (ref 8.9–10.3)
Chloride: 107 mmol/L (ref 98–111)
Creatinine, Ser: 0.98 mg/dL (ref 0.44–1.00)
GFR calc Af Amer: >60 mL/min (ref >60)
GFR calc non Af Amer: >60 mL/min (ref >60)
Glucose, Bld: 140 mg/dL — ABNORMAL HIGH (ref 70–99)
Potassium: 3.4 mmol/L — ABNORMAL LOW (ref 3.5–5.1)
Sodium: 142 mmol/L (ref 135–145)

## 2019-02-11 LAB — I-STAT BETA HCG BLOOD, ED (MC, WL, AP ONLY): I-stat hCG, quantitative: <5 m[IU]/mL (ref <5)

## 2019-02-11 LAB — PROTIME-INR
INR: 1 (ref 0.8–1.2)
Prothrombin Time: 13.3 seconds (ref 11.4–15.2)

## 2019-02-11 LAB — CBC
HCT: 37.4 % (ref 36.0–46.0)
Hemoglobin: 11.8 g/dL — ABNORMAL LOW (ref 12.0–15.0)
MCH: 27.1 pg (ref 26.0–34.0)
MCHC: 31.6 g/dL (ref 30.0–36.0)
MCV: 86 fL (ref 80.0–100.0)
Platelets: 262 10*3/uL (ref 150–400)
RBC: 4.35 MIL/uL (ref 3.87–5.11)
RDW: 14.9 % (ref 11.5–15.5)
WBC: 11.8 10*3/uL — ABNORMAL HIGH (ref 4.0–10.5)
nRBC: 0 % (ref 0.0–0.2)

## 2019-02-11 LAB — TROPONIN I (HIGH SENSITIVITY)
Troponin I (High Sensitivity): 3 ng/L (ref <18)
Troponin I (High Sensitivity): 2 ng/L (ref <18)

## 2019-02-11 MED ORDER — SODIUM CHLORIDE 0.9% FLUSH: 3.0000 mL | Freq: Once | INTRAVENOUS | Status: DC

## 2019-02-11 MED ORDER — NAPROXEN 500 MG PO TABS: 500.0000 mg | ORAL_TABLET | Freq: Two times a day (BID) | ORAL | 0 refills | Status: DC | PRN

## 2019-02-11 MED ORDER — PANTOPRAZOLE SODIUM 20 MG PO TBEC: 20.0000 mg | DELAYED_RELEASE_TABLET | Freq: Every day | ORAL | 0 refills | Status: DC

## 2019-02-11 MED ORDER — DICLOFENAC SODIUM 1 % TD GEL: 2.0000 g | Freq: Four times a day (QID) | TRANSDERMAL | 0 refills | Status: DC | PRN

## 2019-02-11 MED ORDER — IOHEXOL 350 MG/ML SOLN
100.0000 mL | Freq: Once | INTRAVENOUS | Status: AC | PRN
  Administered 2019-02-11: 100 mL via INTRAVENOUS

## 2019-02-11 NOTE — ED Triage Notes (Signed)
Patient reports central chest pain with mild SOB and tingling at left shoulder and mid back for 2 days , denies emesis or diaphoresis , her cardiologist is Dr. Crissie Sickles.

## 2019-02-11 NOTE — Discharge Instructions (Signed)
You were seen in the emergency department today with chest and back discomfort.  Your CT scan and lab work is reassuring.  Starting several medications for reflux but also for musculoskeletal type pain.  Would like you to follow with your primary care doctor to schedule a follow-up appointment.  Return to the emergency department any new or suddenly worsening symptoms.

## 2019-02-11 NOTE — ED Provider Notes (Signed)
Emergency Department Provider Note   I have reviewed the triage vital signs and the nursing notes.   HISTORY  Chief Complaint Chest Pain   HPI Paige Oneill is a 53 y.o. female with PMH of GERD, anemia, and WPW followed by Dr. Ladona Ridgelaylor in the past presents to the emergency department for evaluation of chest pressure with mild dyspnea radiating to the left shoulder, back, upper abdomen.  Patient states that symptoms have been ongoing for 2 days and gradually worsening.  She denies any fevers, chills, cough.  She describes the discomfort in her shoulder as a "pins and needles" but states that the pain in her chest is pressure-like and radiates to the back.  She does have some mild to moderate epigastric discomfort as well.  Denies any numbness or tingling in the extremities.  No headache.  No vision changes.  No similar pain in the past.  Patient states that she has not had palpitations related to her WPW for a Lorelei Heikkila time.   Past Medical History:  Diagnosis Date  . Allergy   . Anemia   . GERD (gastroesophageal reflux disease)    occ  . Palpitations   . Wolff-Parkinson-White (WPW) syndrome     Patient Active Problem List   Diagnosis Date Noted  . Chronic pain of left knee 03/27/2017  . Plantar fasciitis of left foot 02/27/2017  . Morbid obesity (HCC) 03/18/2013  . 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 Dyspnea 11/11/2012  . Palpitations 11/11/2012    Past Surgical History:  Procedure Laterality Date  . APPENDECTOMY  1988  . LEEP    . NSVD     x3  . TUBAL LIGATION      Allergies Penicillins  Family History  Problem Relation Age of Onset  . Hypertension Mother   . Diabetes Mother   . Aneurysm Mother   . Cancer Mother   . Colon cancer Maternal Grandmother   . Heart murmur Daughter   . Colon cancer Brother 52       dx'd 6 months ago as of 01-04-17   . Liver cancer Brother        mets from colon   . Lung cancer Brother        mets from colon   . Thyroid cancer Maternal  Grandfather   . Colon cancer Other   . Colon polyps Neg Hx   . Esophageal cancer Neg Hx     Social History Social History   Tobacco Use  . Smoking status: Never Smoker  . Smokeless tobacco: Never Used  Substance Use Topics  . Alcohol use: Yes    Alcohol/week: 2.0 standard drinks    Types: 2 Standard drinks or equivalent per week    Comment: socially  . Drug use: No    Review of Systems  Constitutional: No fever/chills Eyes: No visual changes. ENT: No sore throat. Cardiovascular: Positive chest pain. Respiratory: Mild shortness of breath. Gastrointestinal: Mild epigastric abdominal pain.  No nausea, no vomiting.  No diarrhea.  No constipation. Genitourinary: Negative for dysuria. Musculoskeletal: Mild thoracic back pain.  Skin: Negative for rash. Neurological: Negative for headaches, focal weakness or numbness.  10-point ROS otherwise negative.  ____________________________________________   PHYSICAL EXAM:  VITAL SIGNS: ED Triage Vitals  Enc Vitals Group     BP 02/11/19 0425 (!) 120/52     Pulse Rate 02/11/19 0425 93     Resp 02/11/19 0425 16     Temp 02/11/19 0425 98.5 F (36.9 C)  Temp Source 02/11/19 0425 Oral     SpO2 02/11/19 0425 100 %   Constitutional: Alert and oriented. Well appearing and in no acute distress. Eyes: Conjunctivae are normal.  Head: Atraumatic. Nose: No congestion/rhinnorhea. Mouth/Throat: Mucous membranes are moist. Neck: No stridor.   Cardiovascular: Normal rate, regular rhythm. Good peripheral circulation. Grossly normal heart sounds.   Respiratory: Normal respiratory effort.  No retractions. Lungs CTAB. Gastrointestinal: Soft and nontender. No distention.  Musculoskeletal: No lower extremity tenderness nor edema. No gross deformities of extremities. Neurologic:  Normal speech and language. No gross focal neurologic deficits are appreciated.  Skin:  Skin is warm, dry and intact. No rash noted.   ____________________________________________   LABS (all labs ordered are listed, but only abnormal results are displayed)  Labs Reviewed  BASIC METABOLIC PANEL - Abnormal; Notable for the following components:      Result Value   Potassium 3.4 (*)    Glucose, Bld 140 (*)    Calcium 8.8 (*)    All other components within normal limits  CBC - Abnormal; Notable for the following components:   WBC 11.8 (*)    Hemoglobin 11.8 (*)    All other components within normal limits  PROTIME-INR  I-STAT BETA HCG BLOOD, ED ([facility], [facility], [facility] ONLY)  TROPONIN I (HIGH SENSITIVITY)  TROPONIN I (HIGH SENSITIVITY)   ____________________________________________  EKG   EKG Interpretation  Date/Time:  Wednesday February 11 2019 04:34:29 EDT Ventricular Rate:  86 PR Interval:  176 QRS Duration: 68 QT Interval:  352 QTC Calculation: 421 R Axis:   68 Text Interpretation:  Normal sinus rhythm Normal ECG No STEMI  Confirmed by Alona Bene [phone]) on 02/11/2019 8:43:28 AM       ____________________________________________  RADIOLOGY  Dg Chest 2 View  Result Date: 02/11/2019 CLINICAL DATA:  Chest pain for 2 days EXAM: CHEST - 2 VIEW COMPARISON:  None FINDINGS: Increased markings at the lung bases that is likely from interstitial crowding. Underpenetration likely accentuates lung markings. There is no edema, consolidation, effusion, or pneumothorax. Normal heart size and mediastinal contours. IMPRESSION: Low volume chest without acute finding. Electronically Signed   By: Marnee Spring M.D.   On: 02/11/2019 05:19   Ct Angio Chest/abd/pel For Dissection W And/or Wo Contrast  Result Date: 02/11/2019 CLINICAL DATA:  Chest and abdominal pain EXAM: CT ANGIOGRAPHY CHEST, ABDOMEN AND PELVIS TECHNIQUE: Initially, axial CT images were obtained through the chest without intravenous contrast material administration. Multidetector CT imaging through the chest, abdomen and pelvis was performed using the standard  protocol during bolus administration of intravenous contrast. Multiplanar reconstructed images and MIPs were obtained and reviewed to evaluate the vascular anatomy. CONTRAST:  OMNIPAQUE IOHEXOL 350 MG/ML SOLN COMPARISON:  Chest radiograph February 11, 2019 FINDINGS: CTA CHEST FINDINGS Cardiovascular: No intramural hematoma is appreciable on the noncontrast enhanced study. There is no appreciable thoracic aortic aneurysm or dissection. There is no evident mediastinal hematoma. The visualized great vessels appear unremarkable. There is no pericardial effusion or pericardial thickening. There is no demonstrable pulmonary embolus. Mediastinum/Nodes: There is no appreciable thoracic adenopathy. There is a small hiatal hernia. Lungs/Pleura: There is no evident pneumothorax. There is no edema or consolidation. There are areas of slight atelectatic change bilaterally. No pleural effusions are evident. No evident fracture or dislocation. No blastic or lytic bone lesions. No evident chest wall lesions. Musculoskeletal: Review of the MIP images confirms the above findings. CTA ABDOMEN AND PELVIS FINDINGS VASCULAR Aorta: There is no abdominal aortic aneurysm  or dissection. No appreciable atherosclerotic plaque noted in the aorta. Celiac: There is slight narrowing at the origin of the celiac artery. The remainder of the celiac artery and its branches appear widely patent. No aneurysm or dissection involving these vessels. SMA: Superior mesenteric artery and its branches are widely patent. No aneurysm or dissection involving these vessels. Renals: On the right, there is a single renal artery. On the left, there is a main renal artery. Slightly inferior to this main left renal artery, there is a small accessory renal artery on the left. Renal arteries and their branches are widely patent throughout their respective courses. No aneurysm or dissection evident. No appreciable fibromuscular dysplasia. IMA: Inferior mesenteric  artery and its branches are widely patent. No aneurysm or dissection involving these vessels. Inflow: No aneurysm or dissection is evident in the pelvic arterial vessels. There is slight calcification along the medial wall of the distal right common iliac artery at its junction with the right inferior iliac artery. There is no hemodynamically significant obstruction in this area. There is minimal calcification in the proximal left external iliac artery without appreciable obstructive disease. Pelvic arterial vessels otherwise are widely patent. Proximal profunda femoral and superficial femoral artery branches are patent bilaterally. No aneurysm or dissection involving these vessel regions. Veins: No obvious venous abnormality within the limitations of this arterial phase study. Review of the MIP images confirms the above findings. NON-VASCULAR Hepatobiliary: Liver measures 20.1 cm in length. There is evident hepatic steatosis. No focal liver lesions are evident. Gallbladder wall is not appreciably thickened. There is no biliary duct dilatation. Pancreas: There is no pancreatic mass or inflammatory focus. Spleen: No splenic lesions are evident. Adrenals/Urinary Tract: Adrenals bilaterally appear normal. Kidneys bilaterally show no evident mass or hydronephrosis on either side. There is no evident renal or ureteral calculus on either side. Urinary bladder is midline with wall thickness within normal limits. Stomach/Bowel: There is no appreciable bowel wall or mesenteric thickening. Terminal ileum appears normal. There is no evident bowel obstruction. There is no free air or portal venous air. Lymphatic: There is no evident adenopathy in the abdomen or pelvis. Reproductive: Uterus is anteverted. There is no evident pelvic mass. Other: Appendix absent. No periappendiceal region inflammatory change. No abscess or ascites is evident in the abdomen or pelvis. There is a small ventral hernia containing only fat.  Musculoskeletal: No blastic or lytic bone lesions are evident. There is no intramuscular lesions. Review of the MIP images confirms the above findings. IMPRESSION: CT angiogram chest: 1. No demonstrable thoracic aortic aneurysm or dissection. 2. No demonstrable pulmonary embolus. 3. Areas of mild atelectasis.  No edema or consolidation. 4. Small hiatal hernia. 5. No evident thoracic adenopathy. CT angiogram abdomen; CT angiogram pelvis: 1. No demonstrable aneurysm or dissection involving the aorta, major pelvic, and major mesenteric arterial vessels. Scattered areas of rather minimal atherosclerosis noted. No fibromuscular dysplasia. 2. Prominent liver with hepatic steatosis. No focal liver lesions evident. 3. No evident bowel obstruction. No abscess in the abdomen or pelvis. Appendix absent. 4. No evident renal or ureteral calculus. No hydronephrosis. Urinary bladder wall thickness within normal limits. Electronically Signed   By: Lowella Grip III M.D.   On: 02/11/2019 11:30    ____________________________________________   PROCEDURES  Procedure(s) performed:   Procedures  None  ____________________________________________   INITIAL IMPRESSION / ASSESSMENT AND PLAN / ED COURSE  Pertinent labs & imaging results that were available during my care of the patient were reviewed by me and  considered in my medical decision making (see chart for details).   Patient presents the emergency department for evaluation of central chest pressure radiating to the left shoulder, back, epigastric abdomen.  Differential is broad at this time.  Lower suspicion for ACS/PE.  I am considering dissection given the radiation of her symptoms although patient has few medical risk factors for this.  Her EKG is unremarkable.  Vital signs are within normal limits.  Her initial blood work and x-rays from triage are normal.   CTA negative for acute findings. Plan for symptom mgmt at home and close PCP follow up.   ____________________________________________  FINAL CLINICAL IMPRESSION(S) / ED DIAGNOSES  Final diagnoses:  Precordial chest pain  Acute midline thoracic back pain  Acute pain of left shoulder     MEDICATIONS GIVEN DURING THIS VISIT:  Medications  iohexol (OMNIPAQUE) 350 MG/ML injection 100 mL (100 mLs Intravenous Contrast Given 02/11/19 1041)     NEW OUTPATIENT MEDICATIONS STARTED DURING THIS VISIT:  Discharge Medication List as of 02/11/2019 11:47 AM    START taking these medications   Details  diclofenac sodium (VOLTAREN) 1 % GEL Apply 2 g topically 4 (four) times daily as needed., Starting Wed 02/11/2019, Print    naproxen (NAPROSYN) 500 MG tablet Take 1 tablet (500 mg total) by mouth 2 (two) times daily as needed for moderate pain., Starting Wed 02/11/2019, Print    pantoprazole (PROTONIX) 20 MG tablet Take 1 tablet (20 mg total) by mouth daily., Starting Wed 02/11/2019, Until Fri 03/13/2019, Print        Note:  This document was prepared using Dragon voice recognition software and may include unintentional dictation errors.  Alona Bene, MD, [facility]    Darik Massing, Arlyss Repress, MD 02/11/19 [phone]

## 2019-02-11 NOTE — ED Notes (Signed)
Patient transported to CT 

## 2019-03-04 ENCOUNTER — Other Ambulatory Visit: Payer: Self-pay

## 2019-03-04 ENCOUNTER — Encounter: Payer: Self-pay | Admitting: Internal Medicine

## 2019-03-04 ENCOUNTER — Ambulatory Visit: Payer: 59 | Admitting: Internal Medicine

## 2019-03-04 ENCOUNTER — Encounter

## 2019-03-04 VITALS — BP 110/78 | HR 70 | Ht 66.0 in | Wt 274.4 lb

## 2019-03-04 DIAGNOSIS — I456 Pre-excitation syndrome: Secondary | ICD-10-CM | POA: Diagnosis not present

## 2019-03-04 MED ORDER — OMEPRAZOLE 20 MG PO CPDR: DELAYED_RELEASE_CAPSULE | ORAL | 1 refills | Status: DC

## 2019-03-04 NOTE — Patient Instructions (Addendum)
Medication Instructions:  Your physician has recommended you make the following change in your medication:   1.  Start taking omeprazole 20 mg-  Take one tablet by mouth twice a day for 2 weeks.  Then take as needed.  Labwork: None ordered.  Testing/Procedures: None ordered.  Follow-Up: Your physician wants you to follow-up in: as needed with Dr. Lovena Le.      Any Other Special Instructions Will Be Listed Below (If Applicable).  If you need a refill on your cardiac medications before your next appointment, please call your pharmacy.

## 2019-03-04 NOTE — Progress Notes (Signed)
HPI Paige Oneill is referred today for evaluation of non-cardiac chest pain. She has a remote h/o WPW and over the years has lost her delta wave and her palpitations have resolved. She has a h/o GERD. She notes that over the past month she has had intermittent chest pain when she leans over or leans backward. She has not had exertional chest pain. No associated sob. She has continued to gain weight.  Allergies  Allergen Reactions  . Penicillins     "Severe reaction as a child"     Current Outpatient Medications  Medication Sig Dispense Refill  . acetaminophen (TYLENOL) 500 MG tablet Take 500 mg by mouth every 8 (eight) hours as needed.     Current Facility-Administered Medications  Medication Dose Route Frequency Provider Last Rate Last Dose  . 0.9 %  sodium chloride infusion  500 mL Intravenous Continuous Meryl Dare, MD         Past Medical History:  Diagnosis Date  . Allergy   . Anemia   . GERD (gastroesophageal reflux disease)    occ  . Palpitations   . Wolff-Parkinson-White (WPW) syndrome     ROS:   All systems reviewed and negative except as noted in the HPI.   Past Surgical History:  Procedure Laterality Date  . APPENDECTOMY  1988  . LEEP    . NSVD     x3  . TUBAL LIGATION       Family History  Problem Relation Age of Onset  . Hypertension Mother   . Diabetes Mother   . Aneurysm Mother   . Cancer Mother   . Colon cancer Maternal Grandmother   . Heart murmur Daughter   . Colon cancer Brother 52       dx'd 6 months ago as of 01-04-17   . Liver cancer Brother        mets from colon   . Lung cancer Brother        mets from colon   . Thyroid cancer Maternal Grandfather   . Colon cancer Other   . Colon polyps Neg Hx   . Esophageal cancer Neg Hx      Social History   Socioeconomic History  . Marital status: Single    Spouse name: Not on file  . Number of children: Not on file  . Years of education: Not on file  . Highest education  level: Not on file  Occupational History  . Not on file  Social Needs  . Financial resource strain: Not on file  . Food insecurity    Worry: Not on file    Inability: Not on file  . Transportation needs    Medical: Not on file    Non-medical: Not on file  Tobacco Use  . Smoking status: Never Smoker  . Smokeless tobacco: Never Used  Substance and Sexual Activity  . Alcohol use: Yes    Alcohol/week: 2.0 standard drinks    Types: 2 Standard drinks or equivalent per week    Comment: socially  . Drug use: No  . Sexual activity: Yes    Partners: Male  Lifestyle  . Physical activity    Days per week: Not on file    Minutes per session: Not on file  . Stress: Not on file  Relationships  . Social Musician on phone: Not on file    Gets together: Not on file    Attends religious service: Not  on file    Active member of club or organization: Not on file    Attends meetings of clubs or organizations: Not on file    Relationship status: Not on file  . Intimate partner violence    Fear of current or ex partner: Not on file    Emotionally abused: Not on file    Physically abused: Not on file    Forced sexual activity: Not on file  Other Topics Concern  . Not on file  Social History Narrative  . Not on file     BP 110/78   Pulse 70   Ht 5\' 6"  (1.676 m)   Wt 274 lb 6.4 oz (124.5 kg)   LMP  (LMP Unknown)   SpO2 99%   BMI 44.29 kg/m   Physical Exam:  Well appearing NAD HEENT: Unremarkable Neck:  No JVD, no thyromegally Lymphatics:  No adenopathy Back:  No CVA tenderness Lungs:  Clear with no wheezes HEART:  Regular rate rhythm, no murmurs, no rubs, no clicks Abd:  soft, positive bowel sounds, no organomegally, no rebound, no guarding Ext:  2 plus pulses, no edema, no cyanosis, no clubbing Skin:  No rashes no nodules Neuro:  CN II through XII intact, motor grossly intact  ECG - reviewed  Assess/Plan: 1. SVT - she has not had any and no evidence of  residual ventricular pre-excitation. 2. Chest pain - I suspec that this represents esophageal spasm. She has know GERD and has not been on any medication. I have started her on omeprazole. 3. Obesity - she is encouraged to lose weight.   Mikle Bosworth.D.

## 2019-03-28 ENCOUNTER — Other Ambulatory Visit: Payer: Self-pay | Admitting: Internal Medicine

## 2019-08-12 ENCOUNTER — Other Ambulatory Visit: Payer: Self-pay

## 2019-08-12 ENCOUNTER — Encounter: Payer: Self-pay | Admitting: Orthopaedic Surgery

## 2019-08-12 ENCOUNTER — Ambulatory Visit (INDEPENDENT_AMBULATORY_CARE_PROVIDER_SITE_OTHER): Payer: 59 | Admitting: Orthopaedic Surgery

## 2019-08-12 DIAGNOSIS — M79605 Pain in left leg: Secondary | ICD-10-CM

## 2019-08-12 DIAGNOSIS — M79662 Pain in left lower leg: Secondary | ICD-10-CM | POA: Diagnosis not present

## 2019-08-12 NOTE — Progress Notes (Signed)
Office Visit Note   Patient: Paige Oneill           Date of Birth: 07-Jun-1965           MRN: [id number] Visit Date: 08/12/2019              Requested by: No referring provider defined for this encounter. PCP: Patient, No Pcp Per   Assessment & Plan: Visit Diagnoses:  1. Pain in left leg   2. Pain of left calf     Plan: I gave her reassurance that this should likely resolve with time but I do feel that it is worth trying a topical anti-inflammatory such as Voltaren gel as well as compressive knee-high's to help with venous return.  I told her that hematomas can take 6 months to a year to resolve and the skin changes are certainly indicative of some type of soft tissue trauma.  Also gave her reassurance that this is not anything that would require any type of surgical intervention.  However, if her pain does persist we can always obtain a MRI to evaluate soft tissues.  All question concerns were answered and addressed.  Follow-up can be as needed.  Follow-Up Instructions: Return if symptoms worsen or fail to improve.   Orders:  No orders of the defined types were placed in this encounter.  No orders of the defined types were placed in this encounter.     Procedures: No procedures performed   Clinical Data: No additional findings.   Subjective: No chief complaint on file. The patient comes in today with chief complaint of left leg pain.  She points to the anterior lower aspect of her left leg as a source of her pain.  She states that she had a fall back in October or November of this past year.  She went to urgent care and x-rays were obtained showing no fracture.  She states she had a very large bruise in this area and what she describes sounds like a hematoma.  They cause some skin discoloration as well.  It still is bothersome for her and she was to make sure she did not really tear anything she states.  She says it causes her to walk with a limp.  She has had no other  injury to that leg before or surgery on that leg.  HPI  Review of Systems She currently denies any headache, chest pain, shortness of breath, fever, chills, nausea, vomiting  Objective: Vital Signs: LMP  (LMP Unknown)   Physical Exam She is alert and orient x3 and in no acute distress Ortho Exam Examination of her left lower extremity shows her knee and foot and ankle function are all intact and normal.  She has 5 out of 5 strength of her foot and ankle.  There is some skin discoloration changes about the mid tibia at the anterior lateral aspect to suggest that she may have had some type of a hematoma at some point with trauma to this area. Specialty Comments:  No specialty comments available.  Imaging: No results found.   PMFS History: Patient Active Problem List   Diagnosis Date Noted  . Chronic pain of left knee 03/27/2017  . Plantar fasciitis of left foot 02/27/2017  . Morbid obesity (HCC) 03/18/2013  . 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 Dyspnea 11/11/2012  . Palpitations 11/11/2012   Past Medical History:  Diagnosis Date  . Allergy   . Anemia   . GERD (gastroesophageal reflux disease)  occ  . Palpitations   . Wolff-Parkinson-White (WPW) syndrome     Family History  Problem Relation Age of Onset  . Hypertension Mother   . Diabetes Mother   . Aneurysm Mother   . Cancer Mother   . Colon cancer Maternal Grandmother   . Heart murmur Daughter   . Colon cancer Brother 52       dx'd 6 months ago as of 01-04-17   . Liver cancer Brother        mets from colon   . Lung cancer Brother        mets from colon   . Thyroid cancer Maternal Grandfather   . Colon cancer Other   . Colon polyps Neg Hx   . Esophageal cancer Neg Hx     Past Surgical History:  Procedure Laterality Date  . APPENDECTOMY  1988  . LEEP    . NSVD     x3  . TUBAL LIGATION     Social History   Occupational History  . Not on file  Tobacco Use  . Smoking status: Never Smoker  . Smokeless tobacco:  Never Used  Substance and Sexual Activity  . Alcohol use: Yes    Alcohol/week: 2.0 standard drinks    Types: 2 Standard drinks or equivalent per week    Comment: socially  . Drug use: No  . Sexual activity: Yes    Partners: Male

## 2020-03-04 ENCOUNTER — Other Ambulatory Visit: Payer: Self-pay | Admitting: Internal Medicine

## 2020-03-04 DIAGNOSIS — R1084 Generalized abdominal pain: Secondary | ICD-10-CM

## 2020-03-07 ENCOUNTER — Ambulatory Visit
Admission: RE | Admit: 2020-03-07 | Discharge: 2020-03-07 | Disposition: A | Discharge status: Home / Self Care | Payer: 59 | Source: Ambulatory Visit | Attending: Internal Medicine | Admitting: Internal Medicine

## 2020-03-07 DIAGNOSIS — R1084 Generalized abdominal pain: Secondary | ICD-10-CM

## 2020-03-07 MED ORDER — IOPAMIDOL (ISOVUE-300) INJECTION 61%
100.0000 mL | Freq: Once | INTRAVENOUS | Status: AC | PRN
  Administered 2020-03-07: 100 mL via INTRAVENOUS

## 2020-07-12 ENCOUNTER — Other Ambulatory Visit: Payer: Self-pay

## 2020-07-12 ENCOUNTER — Encounter: Payer: Self-pay | Admitting: Internal Medicine

## 2020-07-12 ENCOUNTER — Ambulatory Visit: Payer: 59 | Admitting: Internal Medicine

## 2020-07-12 DIAGNOSIS — R072 Precordial pain: Secondary | ICD-10-CM | POA: Diagnosis not present

## 2020-07-12 DIAGNOSIS — I471 Supraventricular tachycardia, unspecified: Secondary | ICD-10-CM | POA: Insufficient documentation

## 2020-07-12 MED ORDER — METOPROLOL TARTRATE 50 MG PO TABS: ORAL_TABLET | ORAL | 0 refills | Status: DC

## 2020-07-12 NOTE — Progress Notes (Signed)
HPI Paige Oneill returns today for followup. 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 She has had chest pain off and on and her symptoms have worsened in severity. Not necessarily associated with activity but at times associated with movement. She continues to gain weight, up over 100 lbs in the past 15 years. She also has dyspnea with exertion. Allergies  Allergen Reactions  . Penicillins     "Severe reaction as a child"     Current Outpatient Medications  Medication Sig Dispense Refill  . omega-3 acid ethyl esters (LOVAZA) 1 g capsule Take 1 capsule by mouth daily.     Current Facility-Administered Medications  Medication Dose Route Frequency Provider Last Rate Last Admin  . 0.9 %  sodium chloride infusion  500 mL Intravenous Continuous Meryl Dare, MD         Past Medical History:  Diagnosis Date  . Allergy   . Anemia   . GERD (gastroesophageal reflux disease)    occ  . Palpitations   . Wolff-Parkinson-White (WPW) syndrome     ROS:   All systems reviewed and negative except as noted in the HPI.   Past Surgical History:  Procedure Laterality Date  . APPENDECTOMY  1988  . LEEP    . NSVD     x3  . TUBAL LIGATION       Family History  Problem Relation Age of Onset  . Hypertension Mother   . Diabetes Mother   . Aneurysm Mother   . Cancer Mother   . Colon cancer Maternal Grandmother   . Heart murmur Daughter   . Colon cancer Brother 52       dx'd 6 months ago as of 01-04-17   . Liver cancer Brother        mets from colon   . Lung cancer Brother        mets from colon   . Thyroid cancer Maternal Grandfather   . Colon cancer Other   . Colon polyps Neg Hx   . Esophageal cancer Neg Hx      Social History   Socioeconomic History  . Marital status: Married    Spouse name: Not on file  . Number of children: Not on file  . Years of education: Not on file  . Highest education  level: Not on file  Occupational History  . Not on file  Tobacco Use  . Smoking status: Never Smoker  . Smokeless tobacco: Never Used  Vaping Use  . Vaping Use: Never used  Substance and Sexual Activity  . Alcohol use: Yes    Alcohol/week: 2.0 standard drinks    Types: 2 Standard drinks or equivalent per week    Comment: socially  . Drug use: No  . Sexual activity: Yes    Partners: Male  Other Topics Concern  . Not on file  Social History Narrative  . Not on file   Social Determinants of Health   Financial Resource Strain: Not on file  Food Insecurity: Not on file  Transportation Needs: Not on file  Physical Activity: Not on file  Stress: Not on file  Social Connections: Not on file  Intimate Partner Violence: Not on file     BP 114/80   Pulse 71   Ht 5\' 6"  (1.676 m)   Wt 289 lb 3.2 oz (131.2 kg)   LMP  (LMP Unknown)  SpO2 97%   BMI 46.68 kg/m   Physical Exam:  Well appearing NAD HEENT: Unremarkable Neck:  No JVD, no thyromegally Lymphatics:  No adenopathy Back:  No CVA tenderness Lungs:  Clear with no wheezes HEART:  Regular rate rhythm, no murmurs, no rubs, no clicks Abd:  soft, positive bowel sounds, no organomegally, no rebound, no guarding Ext:  2 plus pulses, no edema, no cyanosis, no clubbing Skin:  No rashes no nodules Neuro:  CN II through XII intact, motor grossly intact  EKG - nsr  Assess/Plan: 1. Chest  Pain - the etiology remains unclear. She has typical and atypical symptoms. I have recommended she undergo cardiac coronary CT scanning. 2.Obesity - she is encouraged to lose weight.   Sharlot Gowda Taylor,MD

## 2020-07-12 NOTE — Patient Instructions (Addendum)
Medication Instructions:  Your physician recommends that you continue on your current medications as directed. Please refer to the Current Medication list given to you today.  Labwork: You will need lab work when your cardiac CT is scheduled.  Testing/Procedures: Your physician has requested that you have cardiac CT. Cardiac computed tomography (CT) is a painless test that uses an x-ray machine to take clear, detailed pictures of your heart.    Follow-Up: Your physician wants you to follow-up based on results of your cardiac CT.  Any Other Special Instructions Will Be Listed Below (If Applicable).  If you need a refill on your cardiac medications before your next appointment, please call your pharmacy.

## 2020-07-14 ENCOUNTER — Other Ambulatory Visit: Payer: 59

## 2020-07-15 ENCOUNTER — Other Ambulatory Visit: Payer: Self-pay

## 2020-07-15 ENCOUNTER — Other Ambulatory Visit: Payer: 59 | Admitting: *Deleted

## 2020-07-15 DIAGNOSIS — R072 Precordial pain: Secondary | ICD-10-CM

## 2020-07-15 LAB — BASIC METABOLIC PANEL
BUN/Creatinine Ratio: 10 (ref 9–23)
BUN: 9 mg/dL (ref 6–24)
CO2: 23 mmol/L (ref 20–29)
Calcium: 9.1 mg/dL (ref 8.7–10.2)
Chloride: 101 mmol/L (ref 96–106)
Creatinine, Ser: 0.89 mg/dL (ref 0.57–1.00)
Glucose: 126 mg/dL — ABNORMAL HIGH (ref 65–99)
Potassium: 3.8 mmol/L (ref 3.5–5.2)
Sodium: 140 mmol/L (ref 134–144)
eGFR: 77 mL/min/{1.73_m2} (ref >59)

## 2020-07-18 ENCOUNTER — Telehealth (HOSPITAL_COMMUNITY): Payer: Self-pay | Admitting: Emergency Medicine

## 2020-07-18 NOTE — Telephone Encounter (Signed)
Reaching out to patient to offer assistance regarding upcoming cardiac imaging study; pt verbalizes understanding of appt date/time, parking situation and where to check in, pre-test NPO status and medications ordered, and verified current allergies; name and call back number provided for further questions should they arise Rockwell Alexandria RN Navigator Cardiac Imaging [facility] [phone] office [phone] cell  50mg  metop tart 2 hr prior to scan 

## 2020-07-19 ENCOUNTER — Telehealth (HOSPITAL_COMMUNITY): Payer: Self-pay | Admitting: Emergency Medicine

## 2020-07-19 NOTE — Telephone Encounter (Signed)
Calling to offer appt at [facility]; pt states she works closer to [facility] and wishes to keep original appt  Rockwell Alexandria RN Navigator Cardiac Imaging [facility] [phone] Office  [phone] Cell

## 2020-07-20 ENCOUNTER — Ambulatory Visit (HOSPITAL_COMMUNITY)
Admission: RE | Admit: 2020-07-20 | Discharge: 2020-07-20 | Disposition: A | Discharge status: Home / Self Care | Payer: 59 | Source: Ambulatory Visit | Attending: Internal Medicine | Admitting: Internal Medicine

## 2020-07-20 ENCOUNTER — Other Ambulatory Visit: Payer: Self-pay

## 2020-07-20 DIAGNOSIS — R072 Precordial pain: Secondary | ICD-10-CM

## 2020-07-20 MED ORDER — IOHEXOL 350 MG/ML SOLN
90.0000 mL | Freq: Once | INTRAVENOUS | Status: AC | PRN
  Administered 2020-07-20: 90 mL via INTRAVENOUS

## 2020-07-20 MED ORDER — NITROGLYCERIN 0.4 MG SL SUBL
0.8000 mg | SUBLINGUAL_TABLET | Freq: Once | SUBLINGUAL | Status: AC
  Administered 2020-07-20: 0.8 mg via SUBLINGUAL

## 2020-07-20 MED ORDER — NITROGLYCERIN 0.4 MG SL SUBL
SUBLINGUAL_TABLET | SUBLINGUAL | Status: AC
  Filled 2020-07-20: qty 2

## 2020-07-22 ENCOUNTER — Telehealth: Payer: Self-pay

## 2020-07-22 DIAGNOSIS — Z5181 Encounter for therapeutic drug level monitoring: Secondary | ICD-10-CM

## 2020-07-22 DIAGNOSIS — E785 Hyperlipidemia, unspecified: Secondary | ICD-10-CM

## 2020-07-22 NOTE — Telephone Encounter (Signed)
-----   Message from Marinus Maw, MD sent at 07/21/2020  9:30 PM EDT ----- CT scan shows no significant blockages but calcium present. She needs to lose weight and start a statin. Crestor 10 mg daily would be my recommendation. Check fasting lipids and liver panel in 6 weeks.

## 2020-07-22 NOTE — Telephone Encounter (Signed)
Attempted outreach to Pt.  Call went to VM.  VM box not set up.

## 2020-07-28 MED ORDER — ROSUVASTATIN CALCIUM 10 MG PO TABS: 10.0000 mg | ORAL_TABLET | Freq: Every day | ORAL | 3 refills | Status: DC

## 2020-07-28 NOTE — Telephone Encounter (Signed)
Pt given cardiac CT results  Order placed for Crestor  Labs ordered and scheduled.  1 yr recall entered.

## 2020-07-28 NOTE — Telephone Encounter (Signed)
  Patient is returning call regarding results. Please call before 2 pm due to her work schedule.

## 2020-09-13 ENCOUNTER — Other Ambulatory Visit: Payer: 59

## 2020-09-13 ENCOUNTER — Other Ambulatory Visit: Payer: Self-pay

## 2020-09-13 DIAGNOSIS — E785 Hyperlipidemia, unspecified: Secondary | ICD-10-CM

## 2020-09-13 DIAGNOSIS — Z79899 Other long term (current) drug therapy: Secondary | ICD-10-CM

## 2020-09-13 DIAGNOSIS — Z5181 Encounter for therapeutic drug level monitoring: Secondary | ICD-10-CM

## 2020-09-13 LAB — HEPATIC FUNCTION PANEL
ALT: 20 IU/L (ref 0–32)
AST: 19 IU/L (ref 0–40)
Albumin: 4.2 g/dL (ref 3.8–4.9)
Alkaline Phosphatase: 122 IU/L — ABNORMAL HIGH (ref 44–121)
Bilirubin Total: 0.3 mg/dL (ref 0.0–1.2)
Bilirubin, Direct: <0.1 mg/dL (ref 0.00–0.40)
Total Protein: 7 g/dL (ref 6.0–8.5)

## 2020-09-13 LAB — LIPID PANEL
Chol/HDL Ratio: 3.9 ratio (ref 0.0–4.4)
Cholesterol, Total: 168 mg/dL (ref 100–199)
HDL: 43 mg/dL (ref >39)
LDL Chol Calc (NIH): 85 mg/dL (ref 0–99)
Triglycerides: 241 mg/dL — ABNORMAL HIGH (ref 0–149)
VLDL Cholesterol Cal: 40 mg/dL (ref 5–40)

## 2020-12-28 ENCOUNTER — Ambulatory Visit: Payer: 59 | Admitting: Orthopaedic Surgery

## 2020-12-28 ENCOUNTER — Encounter: Payer: Self-pay | Admitting: Orthopaedic Surgery

## 2020-12-28 DIAGNOSIS — M7662 Achilles tendinitis, left leg: Secondary | ICD-10-CM | POA: Diagnosis not present

## 2020-12-28 DIAGNOSIS — M722 Plantar fascial fibromatosis: Secondary | ICD-10-CM

## 2020-12-28 MED ORDER — METHYLPREDNISOLONE 4 MG PO TABS: ORAL_TABLET | ORAL | 0 refills | Status: DC

## 2020-12-28 NOTE — Progress Notes (Signed)
Office Visit Note   Patient: Paige Oneill           Date of Birth: 12/24/1965           MRN: [id number] Visit Date: 12/28/2020              Requested by: No referring provider defined for this encounter. PCP: Patient, No Pcp Per (Inactive)   Assessment & Plan: Visit Diagnoses:  1. Plantar fasciitis of left foot   2. Achilles tendinitis, left leg     Plan:  Discussed with her shoewear and the avoidance of open back shoes.  We will send her to formal therapy for gastrocsoleus stretching, modalities to the distal Achilles and plantar fascia, and home exercise program.  She has a cam walker boot at home and like for her to wear this with the nights he states that he will leave for the next 2 weeks except for when at work where she has to wear steel toed shoes.  After 2 weeks she will place 9/16 heel lifts in both shoes she will already be wearing the night 16th inch heel lift in the right shoe.  Place her on Medrol Dosepak for 6 days she will take no other NSAIDs.  She can continue to use her Voltaren gel to the Achilles and plantar fascial region up to 4 times daily.  Discussed with her the use of either compression socks or stockings would highly recommend these well at work.  See her back in 4 weeks see how she is doing overall.  Follow-Up Instructions: Return in about 4 weeks (around 01/25/2021).   Orders:  No orders of the defined types were placed in this encounter.  No orders of the defined types were placed in this encounter.     Procedures: No procedures performed   Clinical Data: No additional findings.   Subjective: Chief Complaint  Patient presents with   Left Foot - Pain    HPI Paige Oneill is a 55 year old female comes in today with left heel pain and burning.  She states this been ongoing since her fall last year.  Again she tripped over the leg with a sign at the truck stop and fell on the curb.  She states her foot stays swollen.  She has tried elevating  as much as she can.  She does have swelling both lower legs.  She is up on her feet and still toed shoes at work.  She has had no shortness of breath.  She is nondiabetic.  Review of Systems See HPI  Objective: Vital Signs: LMP  (LMP Unknown)   Physical Exam General: Well-developed well-nourished female no acute distress mood affect appropriate Psych: Alert and oriented x3 Ortho Exam Bilateral lower extremities +1 edema bilateral lower legs.  Calves are supple and nontender bilaterally.  Tenderness over the left Achilles just proximal to the insertion.  Thompson's test is negative bilaterally.  Full dorsiflexion plantarflexion bilateral ankles.  Left foot tenderness over the medial tubercle of the calcaneus.  Tight gastrocs bilaterally.  5 out of 5 strength with inversion eversion bilateral feet.  Nontender over the posterior tibial tendons and peroneal tendons bilaterally.  Dorsal pedal pulses are 2+ bilaterally.  There is no rashes skin lesions ulcerations or impending ulceration bilateral feet. Specialty Comments:  No specialty comments available.  Imaging: No results found.   PMFS History: Patient Active Problem List   Diagnosis Date Noted   SVT (supraventricular tachycardia) (HCC) 07/12/2020   Chronic pain  of left knee 03/27/2017   Plantar fasciitis of left foot 02/27/2017   Morbid obesity (HCC) 03/18/2013   WPW syndrome 11/11/2012   Dyspnea 11/11/2012   Palpitations 11/11/2012   Past Medical History:  Diagnosis Date   Allergy    Anemia    GERD (gastroesophageal reflux disease)    occ   Palpitations    Wolff-Parkinson-White (WPW) syndrome     Family History  Problem Relation Age of Onset   Hypertension Mother    Diabetes Mother    Aneurysm Mother    Cancer Mother    Colon cancer Maternal Grandmother    Heart murmur Daughter    Colon cancer Brother 26       dx'd 6 months ago as of 01-04-17    Liver cancer Brother        mets from colon    Lung cancer Brother         mets from colon    Thyroid cancer Maternal Grandfather    Colon cancer Other    Colon polyps Neg Hx    Esophageal cancer Neg Hx     Past Surgical History:  Procedure Laterality Date   APPENDECTOMY  1988   LEEP     NSVD     x3   TUBAL LIGATION     Social History   Occupational History   Not on file  Tobacco Use   Smoking status: Never   Smokeless tobacco: Never  Vaping Use   Vaping Use: Never used  Substance and Sexual Activity   Alcohol use: Yes    Alcohol/week: 2.0 standard drinks    Types: 2 Standard drinks or equivalent per week    Comment: socially   Drug use: No   Sexual activity: Yes    Partners: Male

## 2020-12-28 NOTE — Addendum Note (Signed)
Addended by: Barbette Or on: 12/28/2020 03:09 PM   Modules accepted: Orders

## 2021-01-17 ENCOUNTER — Other Ambulatory Visit: Payer: Self-pay

## 2021-01-17 ENCOUNTER — Encounter: Payer: Self-pay | Admitting: Rehabilitative and Restorative Service Providers"

## 2021-01-17 ENCOUNTER — Ambulatory Visit (INDEPENDENT_AMBULATORY_CARE_PROVIDER_SITE_OTHER): Payer: 59 | Admitting: Rehabilitative and Restorative Service Providers"

## 2021-01-17 DIAGNOSIS — M6281 Muscle weakness (generalized): Secondary | ICD-10-CM

## 2021-01-17 DIAGNOSIS — R262 Difficulty in walking, not elsewhere classified: Secondary | ICD-10-CM

## 2021-01-17 DIAGNOSIS — M25572 Pain in left ankle and joints of left foot: Secondary | ICD-10-CM | POA: Diagnosis not present

## 2021-01-17 DIAGNOSIS — R600 Localized edema: Secondary | ICD-10-CM

## 2021-01-17 NOTE — Therapy (Signed)
[facility] [address] [place], [place], [address] Phone: [phone]   Fax:  [phone]  Physical Therapy Evaluation  Patient Details  Name: Paige Oneill MRN: [id number] Date of Birth: [birth year] Referring Provider (PT): Kirtland Bouchard, [place]   Encounter Date: 01/17/2021   PT End of Session - 01/17/21 0845     Visit Number 1    Number of Visits 20    Date for PT Re-Evaluation 03/28/21    Authorization Type [employer] $17 copay    Progress Note Due on Visit 10    PT Start Time 0852    PT Stop Time 0928    PT Time Calculation (min) 36 min    Activity Tolerance Patient tolerated treatment well    Behavior During Therapy [facility] for tasks assessed/performed             Past Medical History:  Diagnosis Date   Allergy    Anemia    GERD (gastroesophageal reflux disease)    occ   Palpitations    Wolff-Parkinson-White (WPW) syndrome     Past Surgical History:  Procedure Laterality Date   APPENDECTOMY  1988   LEEP     NSVD     x3   TUBAL LIGATION      There were no vitals filed for this visit.    Subjective Assessment - 01/17/21 0854     Subjective Pt. stated complaints of burning and shooting pains in Lt heel and leg.  Pt. stated complaints are limiting for standing, walking, ladder climbing.   Pt. stated complaints off and on for a while.  Worse in last 6 months or so.  Pt. stated a fall over a year ago that resulted in landing on Lt leg (unsure if that was directedly related). Arrived to clinic in walker boot today.  Pt. indicated she can't wear boot at work but does wear it outside of work. 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    Limitations Standing;Walking;House hold activities;Other (comment)   work   Patient Stated Goals Reduce pain, walk better    Currently in Pain? Yes    Pain Score 5    pain at worst 6/10   Pain Location Leg    Pain Orientation Left    Pain Descriptors /  Indicators Burning;Shooting    Pain Type Chronic pain    Pain Onset More than a month ago    Pain Frequency Intermittent    Aggravating Factors  standing, walking, WB, ladder climbing.    Pain Relieving Factors rest, ice pack    Effect of Pain on Daily Activities Limited in standing activity.                [facility] PT Assessment - 01/17/21 0001       Assessment   Medical Diagnosis M72.2 (ICD-10-CM) - Plantar fasciitis of left foot  M76.62 (ICD-10-CM) - Achilles tendinitis, left leg    Referring Provider (PT) Kirtland Bouchard, PA-C    Onset Date/Surgical Date 06/28/20    Hand Dominance Right      Precautions   Precautions None      Balance Screen   Has the patient fallen in the past 6 months No    Has the patient had a decrease in activity level because of a fear of falling?  No    Is the patient reluctant to leave their home because of a fear of falling?  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      Observation/Other Assessments   Observations Localized edema lower Lt leg/foot    Focus on Therapeutic Outcomes (FOTO)  intake 41, predicted 50      ROM / Strength   AROM / PROM / Strength Strength;PROM;AROM      AROM   Overall AROM Comments Pain c DF end range for posterior heel, distal achilles Lt    AROM Assessment Site Ankle    Right/Left Ankle Left;Right    Right Ankle Dorsiflexion 6    Right Ankle Plantar Flexion 50    Right Ankle Inversion 20    Right Ankle Eversion 25    Left Ankle Dorsiflexion -5   -9 in 0 deg knee extension   Left Ankle Plantar Flexion 40    Left Ankle Inversion 18    Left Ankle Eversion 28      Strength   Strength Assessment Site Knee;Ankle    Right/Left Knee Left;Right    Right Knee Flexion 5/5    Right Knee Extension 5/5    Left Knee Flexion 5/5    Left  Knee Extension 5/5    Right/Left Ankle Right;Left    Right Ankle Dorsiflexion 5/5    Right Ankle Plantar Flexion 4/5   10 reps   Right Ankle Inversion 5/5    Right Ankle Eversion 5/5    Left Ankle Dorsiflexion 4/5    Left Ankle Plantar Flexion 2+/5   pain   Left Ankle Inversion 5/5    Left Ankle Eversion 5/5      Palpation   Palpation comment Lt leg : Tenderness and pain noted plantar fascia middle 1/3, lateral near origin at calcaneus, distal achilles near insertion.  Trigger points throughout gastroc medial and lateral.      Special Tests   Other special tests (+) Windlass Lt      Ambulation/Gait   Gait Pattern Decreased stance time - left;Decreased step length - right;Antalgic    Gait Comments walker boot                        Objective measurements completed on examination: See above findings.       [facility] Adult PT Treatment/Exercise - 01/17/21 0001       Exercises   Exercises Other Exercises;Ankle    Other Exercises  HEP instruction/performance c cues for techniques, handout provided.  Trial set performed of each for comprehension and symptom assessment.  HEP consisting of standing gastroc stretch, soleus stretch, DF on step for mobility gains      Manual Therapy   Manual therapy comments DF mobilization c movement to Lt ankle in standing c posterior talocrural glide                     PT Education - 01/17/21 0846     Education Details HEP, POC, DN    Person(s) Educated Patient    Methods Explanation;Demonstration;Handout;Verbal cues    Comprehension Returned demonstration;Verbalized understanding              PT Short Term Goals - 01/17/21 0846       PT SHORT TERM GOAL #1   Title Patient will demonstrate independent use  of home exercise program to maintain progress from in clinic treatments.    Time 3    Period Weeks    Status New    Target Date 02/07/21               PT Long Term Goals - 01/17/21 0846       PT  LONG TERM GOAL #1   Title Patient will demonstrate/report pain at worst less than or equal to 2/10 to facilitate minimal limitation in daily activity secondary to pain symptoms.    Time 10    Period Weeks    Status New    Target Date 03/28/21      PT LONG TERM GOAL #2   Title Patient will demonstrate independent use of home exercise program to facilitate ability to maintain/progress functional gains from skilled physical therapy services.    Time 10    Period Weeks    Status New    Target Date 03/28/21      PT LONG TERM GOAL #3   Title Pt. will demonstrate FOTO outcome > or = 50 % to indicated reduced disability due to condition.    Time 10    Period Weeks    Status New    Target Date 03/28/21      PT LONG TERM GOAL #4   Title Pt. will demonstrate independent ambulation s boot s restriction for house and work activity at [employer].    Time 10    Period Weeks    Status New    Target Date 03/28/21      PT LONG TERM GOAL #5   Title Pt. will demonstrate Lt ankle DF in 90 deg knee flexion > 5 degrees to facilitate DF in ambulation.    Time 10    Period Weeks    Status New    Target Date 03/28/21      Additional Long Term Goals   Additional Long Term Goals Yes      PT LONG TERM GOAL #6   Title Pt. will demonstrate MMT Lt ankle equal to Rt to faclitate usual daily and work activity at [employer].    Time 10    Period Weeks    Status New    Target Date 03/28/21                    Plan - 01/17/21 0847     Clinical Impression Statement Patient is a 55 y.o. who comes to clinic with complaints of Lt foot/leg pain with mobility, strength and movement coordination deficits that impair their ability to perform usual daily and recreational functional activities without increase difficulty/symptoms at this time.  Patient to benefit from skilled PT services to address impairments and limitations to improve to previous level of function without restriction secondary to condition.     Personal Factors and Comorbidities Comorbidity 2    Comorbidities GERD, WPW syndrome    Examination-Activity Limitations Locomotion Level;Stand;Sleep;Sit;Squat;Stairs;Carry;Transfers;Lift    Examination-Participation Restrictions Occupation;Community Activity;Cleaning;Shop    Stability/Clinical Decision Making Stable/Uncomplicated    Clinical Decision Making Low    Rehab Potential Good    PT Frequency Other (comment)   1-2x/week   PT Duration Other (comment)   10 weeks   PT Treatment/Interventions ADLs/Self Care Home Management;Cryotherapy;Electrical Stimulation;Iontophoresis 4mg /ml Dexamethasone;Moist Heat;Balance training;Therapeutic exercise;Therapeutic activities;Functional mobility training;Stair training;Gait training;DME Instruction;Ultrasound;Neuromuscular re-education;Patient/family education;Passive range of motion;Spinal Manipulations;Joint Manipulations;Dry needling;Taping;Manual techniques;Vasopneumatic Device    PT Next Visit Plan DF gains, possible DN for myofascial mobility gains  PT Home Exercise Plan [phone]    Consulted and Agree with Plan of Care Patient             Patient will benefit from skilled therapeutic intervention in order to improve the following deficits and impairments:  Abnormal gait, Decreased endurance, Hypomobility, Pain, Decreased strength, Decreased activity tolerance, Decreased mobility, Difficulty walking, Impaired perceived functional ability, Improper body mechanics, Impaired flexibility, Decreased coordination, Decreased range of motion, Increased edema  Visit Diagnosis: Pain in left ankle and joints of left foot  Muscle weakness (generalized)  Difficulty in walking, not elsewhere classified  Localized edema     Problem List Patient Active Problem List   Diagnosis Date Noted   SVT (supraventricular tachycardia) (HCC) 07/12/2020   Chronic pain of left knee 03/27/2017   Plantar fasciitis of left foot 02/27/2017   Morbid obesity  (HCC) 03/18/2013   WPW syndrome 11/11/2012   Dyspnea 11/11/2012   Palpitations 11/11/2012    Chyrel Masson, PT, DPT, OCS, ATC 01/17/21  10:40 AM    [facility] [address] [place], [place], [address] Phone: [phone]   Fax:  [phone]  Name: Paige Oneill MRN: [id number] Date of Birth: 1966-02-19

## 2021-01-17 NOTE — Patient Instructions (Signed)
Access Code: [email or web address] URL: [email or web address] Date: 01/17/2021 Prepared by: Chyrel Masson  Exercises Gastroc Stretch on Wall - 2 x daily - 7 x weekly - 1 sets - 5 reps - 30 hold Standing Gastroc Stretch on Step - 2 x daily - 7 x weekly - 1 sets - 5 reps - 30 sec hold Soleus Stretch on Wall - 2 x daily - 7 x weekly - 3 sets - 10 reps Standing Dorsiflexion Self-Mobilization on Step - 2 x daily - 7 x weekly - 3 sets - 10 reps - 2-3 hold  Patient Education Trigger Point Dry Needling

## 2021-01-19 ENCOUNTER — Other Ambulatory Visit: Payer: Self-pay

## 2021-01-19 ENCOUNTER — Encounter: Payer: Self-pay | Admitting: Rehabilitative and Restorative Service Providers"

## 2021-01-19 ENCOUNTER — Ambulatory Visit (INDEPENDENT_AMBULATORY_CARE_PROVIDER_SITE_OTHER): Payer: 59 | Admitting: Rehabilitative and Restorative Service Providers"

## 2021-01-19 DIAGNOSIS — R262 Difficulty in walking, not elsewhere classified: Secondary | ICD-10-CM | POA: Diagnosis not present

## 2021-01-19 DIAGNOSIS — M25572 Pain in left ankle and joints of left foot: Secondary | ICD-10-CM | POA: Diagnosis not present

## 2021-01-19 DIAGNOSIS — R600 Localized edema: Secondary | ICD-10-CM

## 2021-01-19 DIAGNOSIS — M6281 Muscle weakness (generalized): Secondary | ICD-10-CM | POA: Diagnosis not present

## 2021-01-19 NOTE — Therapy (Signed)
[facility] Physical Therapy [address] [place], [place], [address] Phone: [phone]   Fax:  [phone]  Physical Therapy Treatment  Patient Details  Name: Paige Oneill MRN: [id number] Date of Birth: Apr 13, 1966 Referring Provider (PT): Kirtland Bouchard, [place]   Encounter Date: 01/19/2021   PT End of Session - 01/19/21 0826     Visit Number 2    Number of Visits 20    Date for PT Re-Evaluation 03/28/21    Authorization Type [employer] $17 copay    Progress Note Due on Visit 10    PT Start Time 0804    PT Stop Time 0843    PT Time Calculation (min) 39 min    Activity Tolerance Patient tolerated treatment well    Behavior During Therapy [facility] for tasks assessed/performed             Past Medical History:  Diagnosis Date   Allergy    Anemia    GERD (gastroesophageal reflux disease)    occ   Palpitations    Wolff-Parkinson-White (WPW) syndrome     Past Surgical History:  Procedure Laterality Date   APPENDECTOMY  1988   LEEP     NSVD     x3   TUBAL LIGATION      There were no vitals filed for this visit.   Subjective Assessment - 01/19/21 0825     Subjective Pt. stated no pain upon arrival today.  Pt. indicated she was going without the boot more.  Pt. indicated no complaints from HEP.    Limitations Standing;Walking;House hold activities;Other (comment)   work   Patient Stated Goals Reduce pain, walk better    Currently in Pain? No/denies    Pain Score 0-No pain    Pain Onset More than a month ago                               [facility] Adult PT Treatment/Exercise - 01/19/21 0001       Manual Therapy   Manual therapy comments Trigger point release compression c movement to medial Lt gastroc.  Percussive device to medial and lateral gastroc.  DF mobilization c movement posterior glide 2 x 10 Lt ankle      Ankle Exercises: Standing   Other Standing Ankle Exercises eccentric focus Lt ankle PF c blue tband 3 x 10       Ankle Exercises: Stretches   Soleus Stretch 30 seconds;5 reps   Lt  on incline board   Gastroc Stretch 30 seconds;5 reps   runner stretch on incilne board, Lt posteriorly     Ankle Exercises: Aerobic   Nustep Lvl 6 UE/LE 8 mins                       PT Short Term Goals - 01/19/21 9390       PT SHORT TERM GOAL #1   Title Patient will demonstrate independent use of home exercise program to maintain progress from in clinic treatments.    Time 3    Period Weeks    Status On-going    Target Date 02/07/21               PT Long Term Goals - 01/17/21 0846       PT LONG TERM GOAL #1   Title Patient will demonstrate/report pain at worst less than or equal to 2/10 to facilitate minimal limitation in  daily activity secondary to pain symptoms.    Time 10    Period Weeks    Status New    Target Date 03/28/21      PT LONG TERM GOAL #2   Title Patient will demonstrate independent use of home exercise program to facilitate ability to maintain/progress functional gains from skilled physical therapy services.    Time 10    Period Weeks    Status New    Target Date 03/28/21      PT LONG TERM GOAL #3   Title Pt. will demonstrate FOTO outcome > or = 50 % to indicated reduced disability due to condition.    Time 10    Period Weeks    Status New    Target Date 03/28/21      PT LONG TERM GOAL #4   Title Pt. will demonstrate independent ambulation s boot s restriction for house and work activity at [employer].    Time 10    Period Weeks    Status New    Target Date 03/28/21      PT LONG TERM GOAL #5   Title Pt. will demonstrate Lt ankle DF in 90 deg knee flexion > 5 degrees to facilitate DF in ambulation.    Time 10    Period Weeks    Status New    Target Date 03/28/21      Additional Long Term Goals   Additional Long Term Goals Yes      PT LONG TERM GOAL #6   Title Pt. will demonstrate MMT Lt ankle equal to Rt to faclitate usual daily and work activity at [employer].     Time 10    Period Weeks    Status New    Target Date 03/28/21                   Plan - 01/19/21 0827     Clinical Impression Statement Tenderness and noted trigger points throughout Lt calf, specifically medial gastroc evident and treated in manual intervention today .  Continued skilled PT services indicated at this time to advance ankle mobility and strength.    Personal Factors and Comorbidities Comorbidity 2    Comorbidities GERD, WPW syndrome    Examination-Activity Limitations Locomotion Level;Stand;Sleep;Sit;Squat;Stairs;Carry;Transfers;Lift    Examination-Participation Restrictions Occupation;Community Activity;Cleaning;Shop    Stability/Clinical Decision Making Stable/Uncomplicated    Rehab Potential Good    PT Frequency Other (comment)   1-2x/week   PT Duration Other (comment)   10 weeks   PT Treatment/Interventions ADLs/Self Care Home Management;Cryotherapy;Electrical Stimulation;Iontophoresis 4mg /ml Dexamethasone;Moist Heat;Balance training;Therapeutic exercise;Therapeutic activities;Functional mobility training;Stair training;Gait training;DME Instruction;Ultrasound;Neuromuscular re-education;Patient/family education;Passive range of motion;Spinal Manipulations;Joint Manipulations;Dry needling;Taping;Manual techniques;Vasopneumatic Device    PT Next Visit Plan Continue myofascial release techniques, transitioning to standing eccentric PF as able.    PT Home Exercise Plan [phone]    Consulted and Agree with Plan of Care Patient             Patient will benefit from skilled therapeutic intervention in order to improve the following deficits and impairments:  Abnormal gait, Decreased endurance, Hypomobility, Pain, Decreased strength, Decreased activity tolerance, Decreased mobility, Difficulty walking, Impaired perceived functional ability, Improper body mechanics, Impaired flexibility, Decreased coordination, Decreased range of motion, Increased edema  Visit  Diagnosis: Pain in left ankle and joints of left foot  Muscle weakness (generalized)  Difficulty in walking, not elsewhere classified  Localized edema     Problem List Patient Active Problem List   Diagnosis Date  Noted   SVT (supraventricular tachycardia) (HCC) 07/12/2020   Chronic pain of left knee 03/27/2017   Plantar fasciitis of left foot 02/27/2017   Morbid obesity (HCC) 03/18/2013   WPW syndrome 11/11/2012   Dyspnea 11/11/2012   Palpitations 11/11/2012    Chyrel Masson, PT, DPT, OCS, ATC 01/19/21  8:43 AM    [facility] Physical Therapy [address] [place], [place], [address] Phone: [phone]   Fax:  [phone]  Name: CABELA PACIFICO MRN: [id number] Date of Birth: 01-Jun-1965

## 2021-01-24 ENCOUNTER — Other Ambulatory Visit: Payer: Self-pay

## 2021-01-24 ENCOUNTER — Encounter: Payer: 59 | Admitting: Physical Therapy

## 2021-01-24 ENCOUNTER — Ambulatory Visit: Payer: 59 | Admitting: Physical Therapy

## 2021-01-24 DIAGNOSIS — R262 Difficulty in walking, not elsewhere classified: Secondary | ICD-10-CM

## 2021-01-24 DIAGNOSIS — M6281 Muscle weakness (generalized): Secondary | ICD-10-CM

## 2021-01-24 DIAGNOSIS — M25572 Pain in left ankle and joints of left foot: Secondary | ICD-10-CM | POA: Diagnosis not present

## 2021-01-24 DIAGNOSIS — R600 Localized edema: Secondary | ICD-10-CM

## 2021-01-24 NOTE — Therapy (Signed)
[facility] [address] [place], [place], [address] Phone: [phone]   Fax:  [phone]  Physical Therapy Treatment  Patient Details  Name: Paige Oneill MRN: [id number] Date of Birth: 08-15-1965 Referring Provider (PT): Kirtland Bouchard, [place]   Encounter Date: 01/24/2021   PT End of Session - 01/24/21 1238     Visit Number 3    Number of Visits 20    Date for PT Re-Evaluation 03/28/21    Authorization Type [employer] $17 copay    Progress Note Due on Visit 10    PT Start Time 1147    PT Stop Time 1230    PT Time Calculation (min) 43 min    Activity Tolerance Patient tolerated treatment well    Behavior During Therapy [facility] for tasks assessed/performed             Past Medical History:  Diagnosis Date   Allergy    Anemia    GERD (gastroesophageal reflux disease)    occ   Palpitations    Wolff-Parkinson-White (WPW) syndrome     Past Surgical History:  Procedure Laterality Date   APPENDECTOMY  1988   LEEP     NSVD     x3   TUBAL LIGATION      There were no vitals filed for this visit.   Subjective Assessment - 01/24/21 1226     Subjective She stated she was sore after the massage but then the pain got a little better and she is overall less sensitive around her heel, but still very sensetive around her plantar fascia at her arch    Limitations Standing;Walking;House hold activities;Other (comment)   work   Patient Stated Goals Reduce pain, walk better    Pain Onset More than a month ago              [facility] Adult PT Treatment/Exercise - 01/24/21 0001       Manual Therapy   Manual therapy comments active compression and palpation with DN to Lt calf, STM to left plantarfascia      Ankle Exercises: Stretches   Slant Board Stretch 3 reps;30 seconds   3 reps gastroc, 3 reps soleus     Ankle Exercises: Aerobic   Nustep Lvl 6 UE/LE 8 mins      Ankle Exercises: Seated   Other Seated Ankle Exercises rocker board 2 min  A-P, 2 min lateral              Trigger Point Dry Needling - 01/24/21 0001     Consent Given? Yes    Education Handout Provided Yes    Muscles Treated Lower Quadrant Gastrocnemius;Soleus    Gastrocnemius Response Twitch response elicited    Soleus Response Twitch response elicited                     PT Short Term Goals - 01/19/21 0826       PT SHORT TERM GOAL #1   Title Patient will demonstrate independent use of home exercise program to maintain progress from in clinic treatments.    Time 3    Period Weeks    Status On-going    Target Date 02/07/21               PT Long Term Goals - 01/17/21 0846       PT LONG TERM GOAL #1   Title Patient will demonstrate/report pain at worst less than or equal to 2/10 to facilitate  minimal limitation in daily activity secondary to pain symptoms.    Time 10    Period Weeks    Status New    Target Date 03/28/21      PT LONG TERM GOAL #2   Title Patient will demonstrate independent use of home exercise program to facilitate ability to maintain/progress functional gains from skilled physical therapy services.    Time 10    Period Weeks    Status New    Target Date 03/28/21      PT LONG TERM GOAL #3   Title Pt. will demonstrate FOTO outcome > or = 50 % to indicated reduced disability due to condition.    Time 10    Period Weeks    Status New    Target Date 03/28/21      PT LONG TERM GOAL #4   Title Pt. will demonstrate independent ambulation s boot s restriction for house and work activity at [employer].    Time 10    Period Weeks    Status New    Target Date 03/28/21      PT LONG TERM GOAL #5   Title Pt. will demonstrate Lt ankle DF in 90 deg knee flexion > 5 degrees to facilitate DF in ambulation.    Time 10    Period Weeks    Status New    Target Date 03/28/21      Additional Long Term Goals   Additional Long Term Goals Yes      PT LONG TERM GOAL #6   Title Pt. will demonstrate MMT Lt ankle equal to  Rt to faclitate usual daily and work activity at [employer].    Time 10    Period Weeks    Status New    Target Date 03/28/21                   Plan - 01/24/21 1239     Clinical Impression Statement Trialed DN today with good response and twich response noted in her gastroc-soleus. I did not perform this to her plantar fascia due to her sensitivity here still, instead performed light STM to her tolerance in this area. We will assess her response to this next visit.    Personal Factors and Comorbidities Comorbidity 2    Comorbidities GERD, WPW syndrome    Examination-Activity Limitations Locomotion Level;Stand;Sleep;Sit;Squat;Stairs;Carry;Transfers;Lift    Examination-Participation Restrictions Occupation;Community Activity;Cleaning;Shop    Stability/Clinical Decision Making Stable/Uncomplicated    Rehab Potential Good    PT Frequency Other (comment)   1-2x/week   PT Duration Other (comment)   10 weeks   PT Treatment/Interventions ADLs/Self Care Home Management;Cryotherapy;Electrical Stimulation;Iontophoresis 4mg /ml Dexamethasone;Moist Heat;Balance training;Therapeutic exercise;Therapeutic activities;Functional mobility training;Stair training;Gait training;DME Instruction;Ultrasound;Neuromuscular re-education;Patient/family education;Passive range of motion;Spinal Manipulations;Joint Manipulations;Dry needling;Taping;Manual techniques;Vasopneumatic Device    PT Next Visit Plan how was DN? transitioning to standing eccentric PF as able.    PT Home Exercise Plan [phone]    Consulted and Agree with Plan of Care Patient             Patient will benefit from skilled therapeutic intervention in order to improve the following deficits and impairments:  Abnormal gait, Decreased endurance, Hypomobility, Pain, Decreased strength, Decreased activity tolerance, Decreased mobility, Difficulty walking, Impaired perceived functional ability, Improper body mechanics, Impaired flexibility, Decreased  coordination, Decreased range of motion, Increased edema  Visit Diagnosis: Pain in left ankle and joints of left foot  Muscle weakness (generalized)  Difficulty in walking, not elsewhere classified  Localized  edema     Problem List Patient Active Problem List   Diagnosis Date Noted   SVT (supraventricular tachycardia) (HCC) 07/12/2020   Chronic pain of left knee 03/27/2017   Plantar fasciitis of left foot 02/27/2017   Morbid obesity (HCC) 03/18/2013   WPW syndrome 11/11/2012   Dyspnea 11/11/2012   Palpitations 11/11/2012    April Manson, PT,DPT 01/24/2021, 12:41 PM  [facility] Physical Therapy [address] [place], [place], [address] Phone: [phone]   Fax:  [phone]  Name: Paige Oneill MRN: [id number] Date of Birth: 07/17/1965

## 2021-01-31 ENCOUNTER — Encounter: Payer: 59 | Admitting: Physical Therapy

## 2021-02-02 ENCOUNTER — Ambulatory Visit: Payer: 59 | Admitting: Rehabilitative and Restorative Service Providers"

## 2021-02-02 ENCOUNTER — Other Ambulatory Visit: Payer: Self-pay

## 2021-02-02 ENCOUNTER — Encounter: Payer: Self-pay | Admitting: Rehabilitative and Restorative Service Providers"

## 2021-02-02 DIAGNOSIS — M25572 Pain in left ankle and joints of left foot: Secondary | ICD-10-CM

## 2021-02-02 DIAGNOSIS — M6281 Muscle weakness (generalized): Secondary | ICD-10-CM | POA: Diagnosis not present

## 2021-02-02 DIAGNOSIS — R600 Localized edema: Secondary | ICD-10-CM

## 2021-02-02 DIAGNOSIS — R262 Difficulty in walking, not elsewhere classified: Secondary | ICD-10-CM

## 2021-02-02 NOTE — Addendum Note (Signed)
Addended by: Chyrel Masson B on: 02/02/2021 09:00 AM   Modules accepted: Orders

## 2021-02-02 NOTE — Therapy (Signed)
[facility] Physical Therapy [address] [place], [place], [address] Phone: [phone]   Fax:  [phone]  Physical Therapy Treatment  Patient Details  Name: Paige Oneill MRN: [id number] Date of Birth: May 07, 1965 Referring Provider (PT): Kirtland Bouchard, [place]   Encounter Date: 02/02/2021   PT End of Session - 02/02/21 0842     Visit Number 4    Number of Visits 20    Date for PT Re-Evaluation 03/28/21    Authorization Type [employer] $17 copay    Progress Note Due on Visit 10    PT Start Time 0842    PT Stop Time 0923    PT Time Calculation (min) 41 min    Activity Tolerance Patient tolerated treatment well    Behavior During Therapy [facility] for tasks assessed/performed             Past Medical History:  Diagnosis Date   Allergy    Anemia    GERD (gastroesophageal reflux disease)    occ   Palpitations    Wolff-Parkinson-White (WPW) syndrome     Past Surgical History:  Procedure Laterality Date   APPENDECTOMY  1988   LEEP     NSVD     x3   TUBAL LIGATION      There were no vitals filed for this visit.   Subjective Assessment - 02/02/21 0843     Subjective Pt. indicated everything seems to be helping.  Still having burning in heel.  Shooting pains are better.    Limitations Standing;Walking;House hold activities;Other (comment)   work   Patient Stated Goals Reduce pain, walk better    Currently in Pain? No/denies    Pain Score 0-No pain    Pain Location Leg   posterior heel   Pain Orientation Left    Pain Descriptors / Indicators Burning    Pain Type Chronic pain    Pain Onset More than a month ago    Pain Frequency Intermittent    Aggravating Factors  burning seems variable    Pain Relieving Factors treatment has helped some                [facility] PT Assessment - 02/02/21 0001       Assessment   Medical Diagnosis M72.2 (ICD-10-CM) - Plantar fasciitis of left foot  M76.62 (ICD-10-CM) - Achilles tendinitis, left leg     Referring Provider (PT) Kirtland Bouchard, PA-C    Onset Date/Surgical Date 06/28/20    Hand Dominance Right      AROM   Left Ankle Dorsiflexion 0    Left Ankle Plantar Flexion 50                           [facility] Adult PT Treatment/Exercise - 02/02/21 0001       Neuro Re-ed    Neuro Re-ed Details  SLS on airex foam no shoe 30 sec x 5, tandem stance on foam 1 min x 2 bilateral      Modalities   Modalities Iontophoresis      Iontophoresis   Type of Iontophoresis Dexamethasone    Location Lt posterior heel    Dose 4-6 hr patch, 1.0 mL    Time 5 min setup/education, 4-6 hr patch for home      Manual Therapy   Manual therapy comments posterior glide mobilization c movement in DF on step 3 x 10 Lt ankle      Ankle Exercises:  Standing   Other Standing Ankle Exercises Eccentric Lt PF lowering 2 x 10      Ankle Exercises: Stretches   Slant Board Stretch 60 seconds;3 reps   runner stretch Lt leg posterior on incilne board                      PT Short Term Goals - 02/02/21 0922       PT SHORT TERM GOAL #1   Title Patient will demonstrate independent use of home exercise program to maintain progress from in clinic treatments.    Time 3    Period Weeks    Status Achieved    Target Date 02/07/21               PT Long Term Goals - 02/02/21 0922       PT LONG TERM GOAL #1   Title Patient will demonstrate/report pain at worst less than or equal to 2/10 to facilitate minimal limitation in daily activity secondary to pain symptoms.    Time 10    Period Weeks    Status On-going    Target Date 03/28/21      PT LONG TERM GOAL #2   Title Patient will demonstrate independent use of home exercise program to facilitate ability to maintain/progress functional gains from skilled physical therapy services.    Time 10    Period Weeks    Status On-going    Target Date 03/28/21      PT LONG TERM GOAL #3   Title Pt. will demonstrate FOTO outcome > or  = 50 % to indicated reduced disability due to condition.    Time 10    Period Weeks    Status On-going    Target Date 03/28/21      PT LONG TERM GOAL #4   Title Pt. will demonstrate independent ambulation s boot s restriction for house and work activity at [employer].    Time 10    Period Weeks    Status Achieved      PT LONG TERM GOAL #5   Title Pt. will demonstrate Lt ankle DF in 90 deg knee flexion > 5 degrees to facilitate DF in ambulation.    Time 10    Period Weeks    Status On-going    Target Date 03/28/21      PT LONG TERM GOAL #6   Title Pt. will demonstrate MMT Lt ankle equal to Rt to faclitate usual daily and work activity at [employer].    Time 10    Period Weeks    Status On-going    Target Date 03/28/21                   Plan - 02/02/21 0901     Clinical Impression Statement Overall symptom reduction noted to this point, burning in posterior heel as chief complaint at this time. Dry needling was reported as helpful and may be used again in future.  Application of ionto patch trial to address burning complaints in heel.  Ankle mobility improving mildly from evaluation to today.  Continued skilled PT services waranted at this time.    Personal Factors and Comorbidities Comorbidity 2    Comorbidities GERD, WPW syndrome    Examination-Activity Limitations Locomotion Level;Stand;Sleep;Sit;Squat;Stairs;Carry;Transfers;Lift    Examination-Participation Restrictions Occupation;Community Activity;Cleaning;Shop    Stability/Clinical Decision Making Stable/Uncomplicated    Rehab Potential Good    PT Frequency Other (comment)   1-2x/week   PT Duration  Other (comment)   10 weeks   PT Treatment/Interventions ADLs/Self Care Home Management;Cryotherapy;Electrical Stimulation;Iontophoresis 4mg /ml Dexamethasone;Moist Heat;Balance training;Therapeutic exercise;Therapeutic activities;Functional mobility training;Stair training;Gait training;DME Instruction;Ultrasound;Neuromuscular  re-education;Patient/family education;Passive range of motion;Spinal Manipulations;Joint Manipulations;Dry needling;Taping;Manual techniques;Vasopneumatic Device    PT Next Visit Plan DN as desired for calf.  Ionto use for posterior heel.    PT Home Exercise Plan [phone]    Consulted and Agree with Plan of Care Patient             Patient will benefit from skilled therapeutic intervention in order to improve the following deficits and impairments:  Abnormal gait, Decreased endurance, Hypomobility, Pain, Decreased strength, Decreased activity tolerance, Decreased mobility, Difficulty walking, Impaired perceived functional ability, Improper body mechanics, Impaired flexibility, Decreased coordination, Decreased range of motion, Increased edema  Visit Diagnosis: Pain in left ankle and joints of left foot  Muscle weakness (generalized)  Difficulty in walking, not elsewhere classified  Localized edema     Problem List Patient Active Problem List   Diagnosis Date Noted   SVT (supraventricular tachycardia) (HCC) 07/12/2020   Chronic pain of left knee 03/27/2017   Plantar fasciitis of left foot 02/27/2017   Morbid obesity (HCC) 03/18/2013   WPW syndrome 11/11/2012   Dyspnea 11/11/2012   Palpitations 11/11/2012   11/13/2012, PT, DPT, OCS, ATC 02/02/21  9:23 AM    [facility] Therapy [address] [place], [place], [place] Phone: [phone]   Fax:  [phone]  Name: ANIESHA HAUGHN MRN: Alveta Heimlich Date of Birth: Apr 08, 1966

## 2021-02-07 ENCOUNTER — Encounter: Payer: 59 | Admitting: Rehabilitative and Restorative Service Providers"

## 2021-02-07 ENCOUNTER — Telehealth: Payer: Self-pay | Admitting: Rehabilitative and Restorative Service Providers"

## 2021-02-07 NOTE — Telephone Encounter (Signed)
Unable to leave message due to voice mailbox not set up.  Chyrel Masson, PT, DPT, OCS, ATC 02/07/21  9:48 AM

## 2021-02-09 ENCOUNTER — Encounter: Payer: 59 | Admitting: Rehabilitative and Restorative Service Providers"

## 2021-02-14 ENCOUNTER — Ambulatory Visit (INDEPENDENT_AMBULATORY_CARE_PROVIDER_SITE_OTHER): Payer: 59 | Admitting: Rehabilitative and Restorative Service Providers"

## 2021-02-14 ENCOUNTER — Other Ambulatory Visit: Payer: Self-pay

## 2021-02-14 ENCOUNTER — Encounter: Payer: Self-pay | Admitting: Rehabilitative and Restorative Service Providers"

## 2021-02-14 DIAGNOSIS — M6281 Muscle weakness (generalized): Secondary | ICD-10-CM | POA: Diagnosis not present

## 2021-02-14 DIAGNOSIS — M25572 Pain in left ankle and joints of left foot: Secondary | ICD-10-CM

## 2021-02-14 DIAGNOSIS — R262 Difficulty in walking, not elsewhere classified: Secondary | ICD-10-CM | POA: Diagnosis not present

## 2021-02-14 NOTE — Patient Instructions (Signed)
Access Code: [email or web address] URL: [email or web address] Date: 02/14/2021 Prepared by: Chyrel Masson  Exercises Gastroc Stretch on Wall - 2 x daily - 7 x weekly - 1 sets - 5 reps - 30 hold Standing Gastroc Stretch on Step - 2 x daily - 7 x weekly - 1 sets - 5 reps - 30 sec hold Soleus Stretch on Wall - 2 x daily - 7 x weekly - 3 sets - 10 reps Standing Dorsiflexion Self-Mobilization on Step - 2 x daily - 7 x weekly - 3 sets - 10 reps - 2-3 hold Tandem Stance - 1 x daily - 7 x weekly - 1 sets - 5 reps - 30 hold Single Leg Stance - 1 x daily - 7 x weekly - 3 sets - 5 reps - 30 hold Heel Toe Raises with Counter Support - 1 x daily - 7 x weekly - 3 sets - 10 reps Standing Heel Raise - 1 x daily - 7 x weekly - 3 sets - 10 reps

## 2021-02-14 NOTE — Therapy (Addendum)
[facility] Physical Therapy [address] [place], [place], [address] Phone: [phone]   Fax:  [phone]  Physical Therapy Treatment /Discharge   Patient Details  Name: Paige Oneill MRN: [id number] Date of Birth: August 26, 1965 Referring Provider (PT): Pete Pelt, [place]   Encounter Date: 02/14/2021   PT End of Session - 02/14/21 0849     Visit Number 5    Number of Visits 20    Date for PT Re-Evaluation 03/28/21    Authorization Type [employer] $17 copay    Progress Note Due on Visit 10    PT Start Time 0849    PT Stop Time 0928    PT Time Calculation (min) 39 min    Activity Tolerance Patient tolerated treatment well    Behavior During Therapy [facility] for tasks assessed/performed             Past Medical History:  Diagnosis Date   Allergy    Anemia    GERD (gastroesophageal reflux disease)    occ   Palpitations    Wolff-Parkinson-White (WPW) syndrome     Past Surgical History:  Procedure Laterality Date   APPENDECTOMY  1988   LEEP     NSVD     x3   TUBAL LIGATION      There were no vitals filed for this visit.   Subjective Assessment - 02/14/21 0850     Subjective Pt. indicated no shooting pains.  Seemed to get some improvement from last visit c ionto. Pt. indicated improvement in walking capability.    Limitations Standing;Walking;House hold activities;Other (comment)   work   Patient Stated Goals Reduce pain, walk better    Currently in Pain? No/denies    Pain Score 0-No pain   pain at worst in last week 4/10   Pain Onset More than a month ago                [facility] PT Assessment - 02/14/21 0001       Assessment   Medical Diagnosis M72.2 (ICD-10-CM) - Plantar fasciitis of left foot  M76.62 (ICD-10-CM) - Achilles tendinitis, left leg    Referring Provider (PT) Pete Pelt, PA-C    Onset Date/Surgical Date 06/28/20    Hand Dominance Right      Observation/Other Assessments   Focus on Therapeutic Outcomes (FOTO)   predicted 62%      AROM   Left Ankle Dorsiflexion 4   in 90 deg knee flexion sitting     Ambulation/Gait   Ambulation/Gait Yes    Ambulation/Gait Assistance 7: Independent                           [facility] Adult PT Treatment/Exercise - 02/14/21 0001       Neuro Re-ed    Neuro Re-ed Details  heel/toe alternative lifts in standing x 20, SLS 30 sec x 2 bilateral, tandem stance 1 min x 1 bilateral      Exercises   Exercises Other Exercises    Other Exercises  Review of HEP c verbal cues and handout provided again      Iontophoresis   Type of Iontophoresis Dexamethasone    Location Lt posterior heel    Dose 4-6 hr patch, 1.0 mL    Time 5 min setup, 4-6 hr patch for home      Ankle Exercises: Aerobic   Recumbent Bike Lvl 3 8 mins      Ankle Exercises:  Stretches   Set designer 60 seconds;3 reps   runner stretch Lt LE posterior on incline board   Other Stretch DF on step in lunge x 15 Lt                     PT Education - 02/14/21 0913     Education Details HEP review    Person(s) Educated Patient    Methods Explanation;Demonstration;Verbal cues;Handout    Comprehension Returned demonstration;Verbalized understanding              PT Short Term Goals - 02/02/21 2841       PT SHORT TERM GOAL #1   Title Patient will demonstrate independent use of home exercise program to maintain progress from in clinic treatments.    Time 3    Period Weeks    Status Achieved    Target Date 02/07/21               PT Long Term Goals - 02/14/21 0902       PT LONG TERM GOAL #1   Title Patient will demonstrate/report pain at worst less than or equal to 2/10 to facilitate minimal limitation in daily activity secondary to pain symptoms.    Time 10    Period Weeks    Status On-going      PT LONG TERM GOAL #2   Title Patient will demonstrate independent use of home exercise program to facilitate ability to maintain/progress functional gains from  skilled physical therapy services.    Time 10    Period Weeks    Status Achieved      PT LONG TERM GOAL #3   Title Pt. will demonstrate FOTO outcome > or = 50 % to indicated reduced disability due to condition.    Time 10    Period Weeks    Status Achieved      PT LONG TERM GOAL #4   Title Pt. will demonstrate independent ambulation s boot s restriction for house and work activity at [employer].    Time 10    Period Weeks    Status Achieved      PT LONG TERM GOAL #5   Title Pt. will demonstrate Lt ankle DF in 90 deg knee flexion > 5 degrees to facilitate DF in ambulation.    Time 10    Period Weeks    Status On-going    Target Date 03/28/21      PT LONG TERM GOAL #6   Title Pt. will demonstrate MMT Lt ankle equal to Rt to faclitate usual daily and work activity at [employer].    Time 10    Period Weeks    Status On-going    Target Date 03/28/21                   Plan - 02/14/21 0903     Clinical Impression Statement FOTO reassessment indicated good improvement compared to evaluation level.  Pt. has demonstrated gains as noted c reduced symptoms and improving walking.  Pt. indicated desire to attempt HEP for period of time to assess ability to continue progression.  Due to gains to this point, clinician in agreement.    Personal Factors and Comorbidities Comorbidity 2    Comorbidities GERD, WPW syndrome    Examination-Activity Limitations Locomotion Level;Stand;Sleep;Sit;Squat;Stairs;Carry;Transfers;Lift    Examination-Participation Restrictions Occupation;Community Activity;Cleaning;Shop    Stability/Clinical Decision Making Stable/Uncomplicated    Rehab Potential Good    PT Frequency Other (comment)  1-2x/week   PT Duration Other (comment)   10 weeks   PT Treatment/Interventions ADLs/Self Care Home Management;Cryotherapy;Electrical Stimulation;Iontophoresis 54m/ml Dexamethasone;Moist Heat;Balance training;Therapeutic exercise;Therapeutic activities;Functional mobility  training;Stair training;Gait training;DME Instruction;Ultrasound;Neuromuscular re-education;Patient/family education;Passive range of motion;Spinal Manipulations;Joint Manipulations;Dry needling;Taping;Manual techniques;Vasopneumatic Device    PT Next Visit Plan Trial HEP.    PT Home Exercise Plan [phone]   Consulted and Agree with Plan of Care Patient             Patient will benefit from skilled therapeutic intervention in order to improve the following deficits and impairments:  Abnormal gait, Decreased endurance, Hypomobility, Pain, Decreased strength, Decreased activity tolerance, Decreased mobility, Difficulty walking, Impaired perceived functional ability, Improper body mechanics, Impaired flexibility, Decreased coordination, Decreased range of motion, Increased edema  Visit Diagnosis: Pain in left ankle and joints of left foot  Muscle weakness (generalized)  Difficulty in walking, not elsewhere classified     Problem List Patient Active Problem List   Diagnosis Date Noted   SVT (supraventricular tachycardia) ([place] 07/12/2020   Chronic pain of left knee 03/27/2017   Plantar fasciitis of left foot 02/27/2017   Morbid obesity ([place] 03/18/2013   WPW syndrome 11/11/2012   Dyspnea 11/11/2012   Palpitations 11/11/2012    MScot Jun PT, DPT, OCS, ATC 02/14/21  9:29 AM  PHYSICAL THERAPY DISCHARGE SUMMARY  Visits from Start of Care: 5  Current functional level related to goals / functional outcomes: See note   Remaining deficits: See note   Education / Equipment: HEP   Patient agrees to discharge. Patient goals were partially met. Patient is being discharged due to not returning since the last visit.  MScot Jun PT, DPT, OCS, ATC 04/06/21  10:45 AM     [facility] [address] [address]Phone: [phone]  Fax:  [phone] Name: Paige TAMERMRN: [id number] of Birth:  106/22/1967

## 2021-02-16 ENCOUNTER — Encounter: Payer: 59 | Admitting: Rehabilitative and Restorative Service Providers"

## 2021-02-21 ENCOUNTER — Encounter: Payer: 59 | Admitting: Rehabilitative and Restorative Service Providers"

## 2021-02-23 ENCOUNTER — Encounter: Payer: 59 | Admitting: Rehabilitative and Restorative Service Providers"

## 2021-04-20 IMAGING — CR DG CHEST 2V
2 series · 2 of 2 positions shown · non-contrast
Comparison: None

CLINICAL DATA: Chest pain for 2 days

EXAM:
CHEST - 2 VIEW

[chest pa]
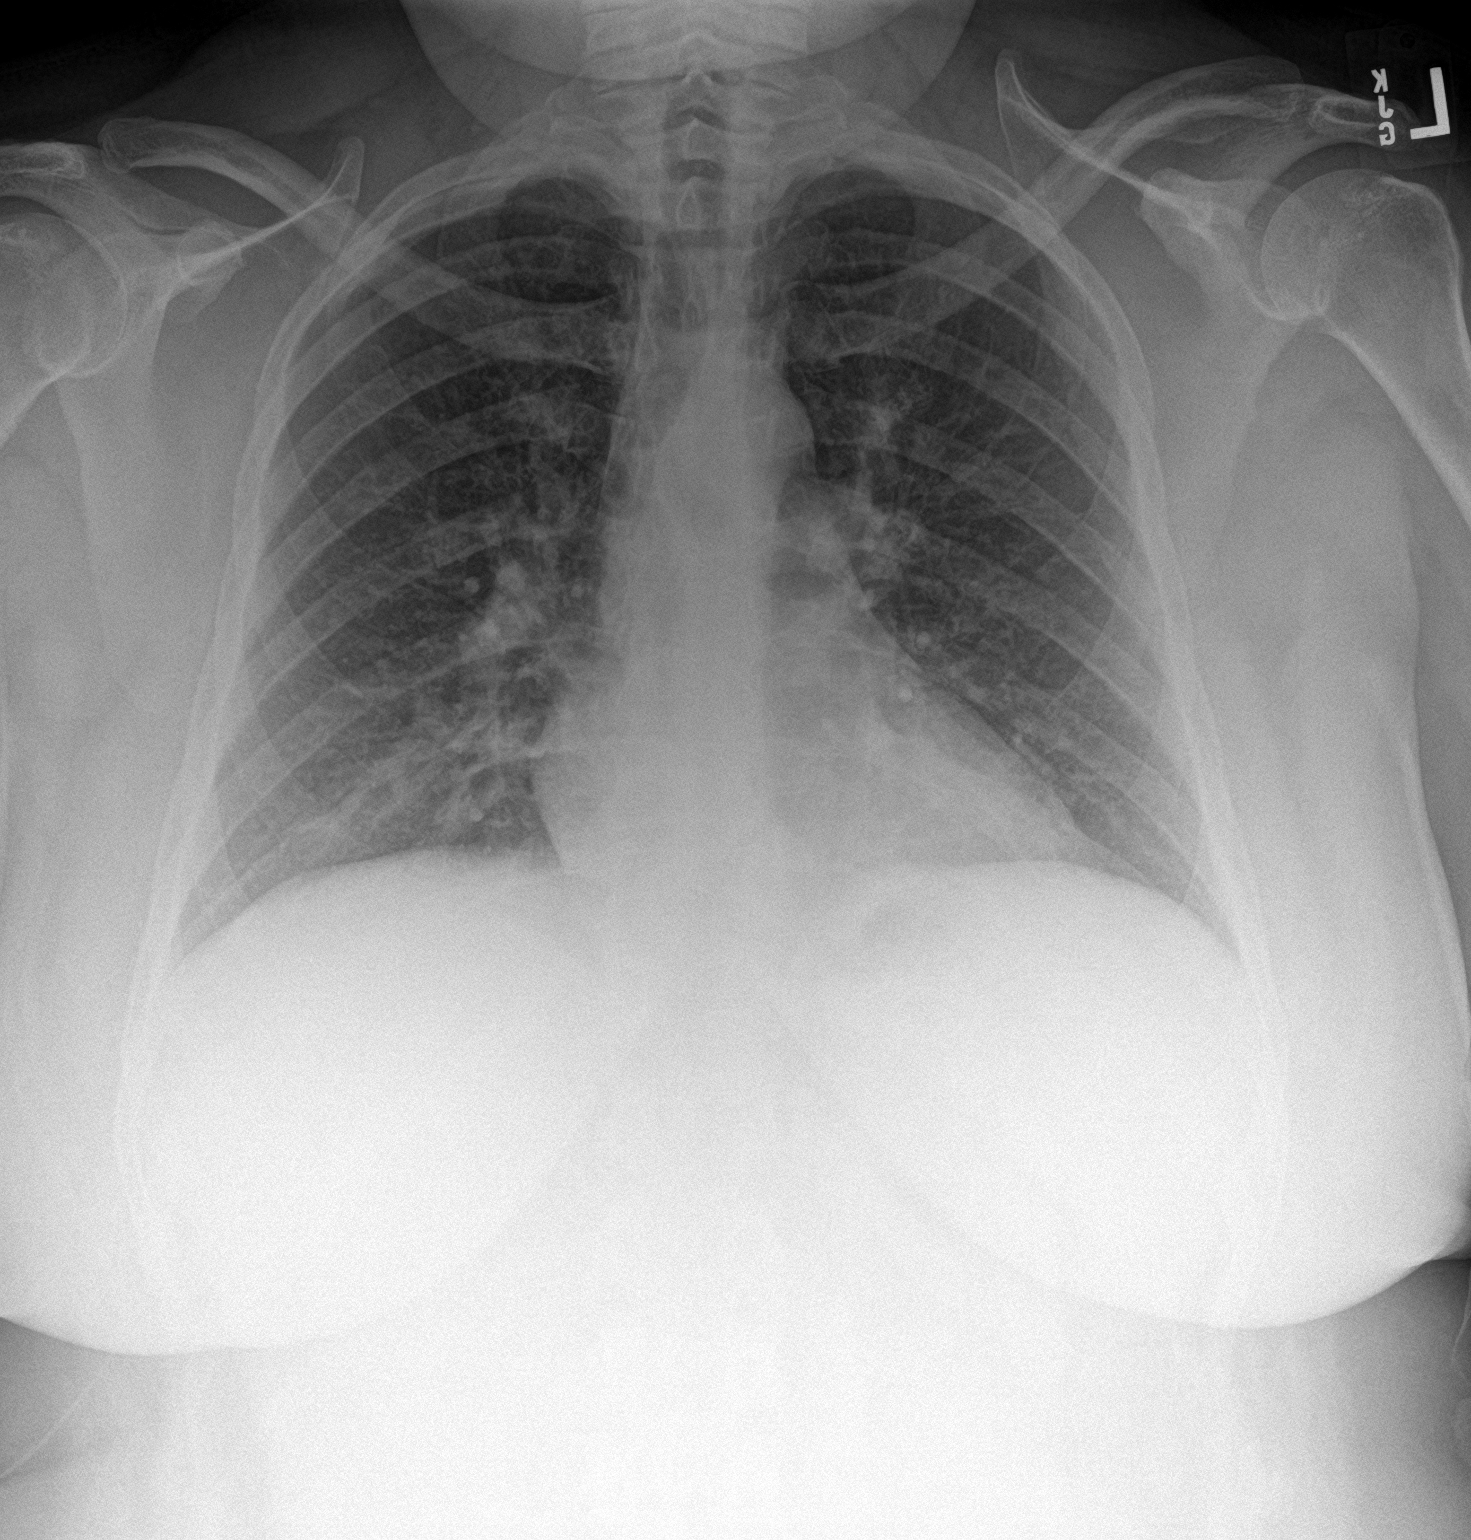

[chest lat]
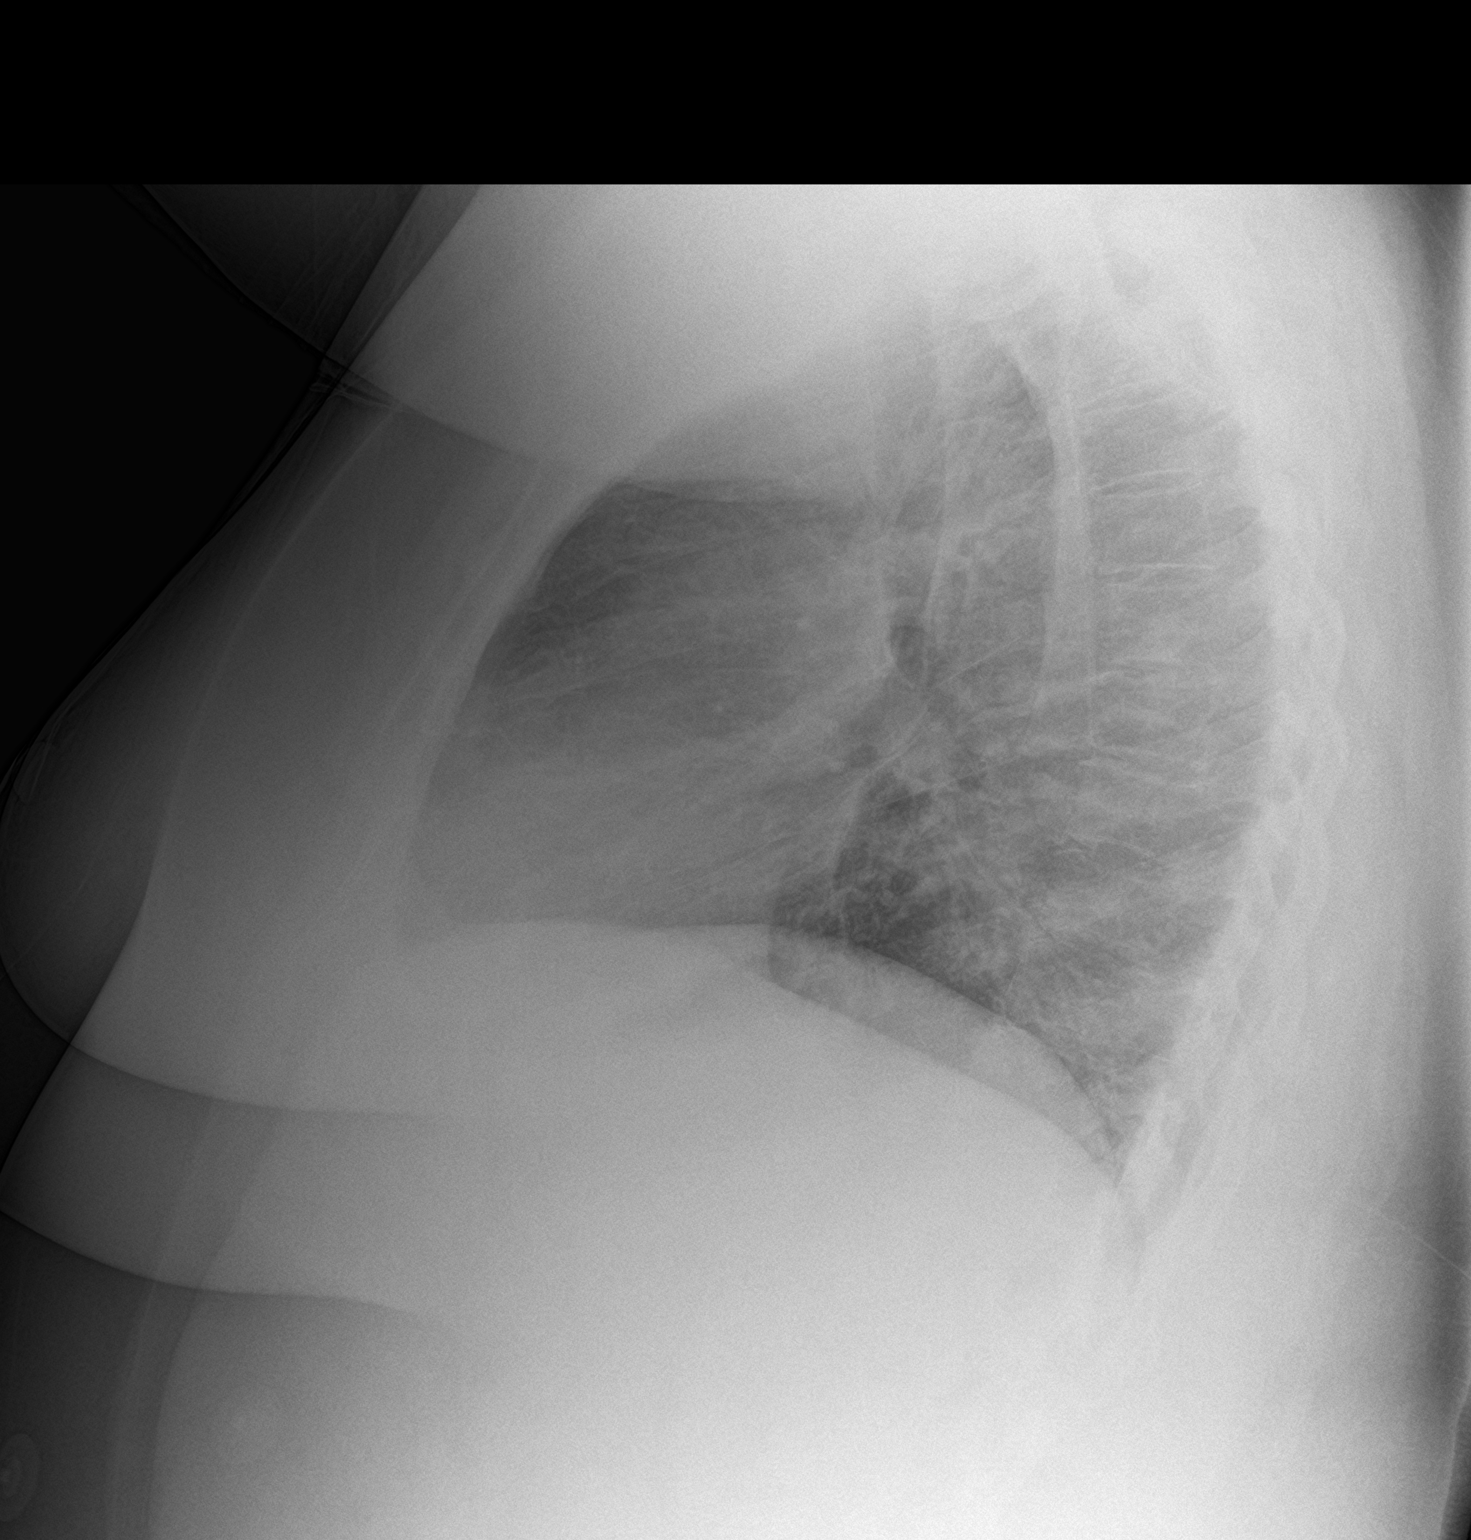

[2 of 2 positions shown; findings below may reference images not displayed]

FINDINGS: Increased markings at the lung bases that is likely from
interstitial crowding. Underpenetration likely accentuates lung
markings. There is no edema, consolidation, effusion, or
pneumothorax. Normal heart size and mediastinal contours.
IMPRESSION: Low volume chest without acute finding.

## 2021-10-08 NOTE — Progress Notes (Unsigned)
Cardiology Office Note Date:  10/08/2021  Patient ID:  Paige Oneill, Paige Oneill 06/01/1965, MRN [id number] PCP:  Patient, No Pcp Per (Inactive)  Electrophysiologist: Dr. Lovena Le  ***refresh   Chief Complaint: ***annual visit  History of Present Illness: Paige Oneill is a 56 y.o. 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 Her pathway conduction was fairly poor and was not thought to be dangerous. Because of the location of the pathway and because of her relatively young age, it was deemed most appropriate not to proceed with catheter ablation  She comes in today to be seen for Dr. Lovena Le, last seen by him March 2022, at that time with some c/o CP, SOB. Noted ongoing weight gain of about 100lbs in 15 years CP felt to have both typical and atypical features, planned for coronary CT.  CT with non-obstructive CAD  *** symptoms *** lipids??? *** syncope? Palps?   Past Medical History:  Diagnosis Date   Allergy    Anemia    GERD (gastroesophageal reflux disease)    occ   Palpitations    Wolff-Parkinson-White (WPW) syndrome     Past Surgical History:  Procedure Laterality Date   APPENDECTOMY  1988   LEEP     NSVD     x3   TUBAL LIGATION      Current Outpatient Medications  Medication Sig Dispense Refill   methylPREDNISolone (MEDROL) 4 MG tablet Take as directed 21 tablet 0   metoprolol tartrate (LOPRESSOR) 50 MG tablet Take one tablet by mouth 2 hours prior to your cardiac CT 1 tablet 0   omega-3 acid ethyl esters (LOVAZA) 1 g capsule Take 1 capsule by mouth daily.     rosuvastatin (CRESTOR) 10 MG tablet Take 1 tablet (10 mg total) by mouth daily. 90 tablet 3   Current Facility-Administered Medications  Medication Dose Route Frequency Provider Last Rate Last Admin   0.9 %   sodium chloride infusion  500 mL Intravenous Continuous Ladene Artist, MD        Allergies:   Penicillins   Social History:  The patient  reports that she has never smoked. She has never used smokeless tobacco. She reports current alcohol use of about 2.0 standard drinks of alcohol per week. She reports that she does not use drugs.   Family History:  The patient's family history includes Aneurysm in her mother; Cancer in her mother; Colon cancer in her maternal grandmother and another family member; Colon cancer (age of onset: 64) in her brother; Diabetes in her mother; Heart murmur in her daughter; Hypertension in her mother; Liver cancer in her brother; Lung cancer in her brother; Thyroid cancer in her maternal grandfather.  ROS:  Please see the history of present illness.    All other systems are reviewed and otherwise negative.   PHYSICAL EXAM:  VS:  LMP  (LMP Unknown)  BMI: There is no height or weight on file to calculate BMI. Well nourished, well developed, in no acute distress HEENT: normocephalic, atraumatic Neck: no JVD, carotid bruits or masses Cardiac:  *** RRR; no significant murmurs, no rubs, or gallops Lungs:  *** CTA b/l, no wheezing, rhonchi or rales Abd: soft, nontender MS: no deformity or *** atrophy Ext: *** no edema Skin: warm and dry, no rash Neuro:  No  gross deficits appreciated Psych: euthymic mood, full affect   EKG:  Done today and reviewed by myself shows  ***  06/22/20: Coronary CT IMPRESSION: 1. Coronary artery calcium score 108 Agatston units. This places the patient in the 95th percentile for age and gender, suggesting high risk for future cardiac events.   2.  Nonobstructive coronary disease in the LM, LAD, and RCA.   11/25/2012: TTE Study Conclusions - Left ventricle: The cavity size was normal. Wall thickness    was normal. Systolic function was normal. The estimated    ejection fraction was in the range of 55% to 65%. Wall    motion was  normal; there were no regional wall motion    abnormalities. Left ventricular diastolic function    parameters were normal.  - Atrial septum: There was increased thickness of the    septum, consistent with lipomatous hypertrophy.   Recent Labs: No results found for requested labs within last 365 days.  No results found for requested labs within last 365 days.   CrCl cannot be calculated (Patient's most recent lab result is older than the maximum 21 days allowed.).   Wt Readings from Last 3 Encounters:  07/12/20 289 lb 3.2 oz (131.2 kg)  03/04/19 274 lb 6.4 oz (124.5 kg)  02/11/17 270 lb (122.5 kg)     Other studies reviewed: Additional studies/records reviewed today include: summarized above  ASSESSMENT AND PLAN:  WPW ***  Disposition: F/u with ***  Current medicines are reviewed at length with the patient today.  The patient did not have any concerns regarding medicines.  Venetia Night, PA-C 10/08/2021 1:32 PM     [address] [place] [address] [phone] (office)  [phone] (fax)

## 2021-10-10 ENCOUNTER — Encounter: Payer: Self-pay | Admitting: Physician Assistant

## 2021-10-10 ENCOUNTER — Ambulatory Visit (INDEPENDENT_AMBULATORY_CARE_PROVIDER_SITE_OTHER): Payer: 59

## 2021-10-10 ENCOUNTER — Ambulatory Visit: Payer: 59 | Admitting: Physician Assistant

## 2021-10-10 VITALS — BP 132/86 | HR 62 | Ht 66.0 in | Wt 286.0 lb

## 2021-10-10 DIAGNOSIS — R002 Palpitations: Secondary | ICD-10-CM

## 2021-10-10 DIAGNOSIS — I471 Supraventricular tachycardia: Secondary | ICD-10-CM | POA: Diagnosis not present

## 2021-10-10 DIAGNOSIS — I456 Pre-excitation syndrome: Secondary | ICD-10-CM | POA: Diagnosis not present

## 2021-10-10 DIAGNOSIS — R0789 Other chest pain: Secondary | ICD-10-CM

## 2021-10-10 LAB — LIPID PANEL
Chol/HDL Ratio: 6 ratio — ABNORMAL HIGH (ref 0.0–4.4)
Cholesterol, Total: 239 mg/dL — ABNORMAL HIGH (ref 100–199)
HDL: 40 mg/dL (ref >39)
LDL Chol Calc (NIH): 159 mg/dL — ABNORMAL HIGH (ref 0–99)
Triglycerides: 216 mg/dL — ABNORMAL HIGH (ref 0–149)
VLDL Cholesterol Cal: 40 mg/dL (ref 5–40)

## 2021-10-10 LAB — COMPREHENSIVE METABOLIC PANEL
ALT: 17 IU/L (ref 0–32)
AST: 17 IU/L (ref 0–40)
Albumin/Globulin Ratio: 1.7 (ref 1.2–2.2)
Albumin: 4.3 g/dL (ref 3.8–4.9)
Alkaline Phosphatase: 123 IU/L — ABNORMAL HIGH (ref 44–121)
BUN/Creatinine Ratio: 11 (ref 9–23)
BUN: 9 mg/dL (ref 6–24)
Bilirubin Total: 0.3 mg/dL (ref 0.0–1.2)
CO2: 24 mmol/L (ref 20–29)
Calcium: 9.6 mg/dL (ref 8.7–10.2)
Chloride: 103 mmol/L (ref 96–106)
Creatinine, Ser: 0.84 mg/dL (ref 0.57–1.00)
Globulin, Total: 2.5 g/dL (ref 1.5–4.5)
Glucose: 106 mg/dL — ABNORMAL HIGH (ref 70–99)
Potassium: 4.1 mmol/L (ref 3.5–5.2)
Sodium: 140 mmol/L (ref 134–144)
Total Protein: 6.8 g/dL (ref 6.0–8.5)
eGFR: 82 mL/min/{1.73_m2} (ref >59)

## 2021-10-10 NOTE — Progress Notes (Unsigned)
Enrolled patient for a 14 day Zio XT monitor to be mailed to patients home   Dr Lovena Le to read  [id number] mailed to patient fell off in 2 days.  [id number] from office inventory applied to patient using tincture of benzoin.  Linton Ham from [employer] notified to cancel charge on first monitor and associate new serial # with enrollment.

## 2021-10-10 NOTE — Patient Instructions (Addendum)
Medication Instructions:   Your physician recommends that you continue on your current medications as directed. Please refer to the Current Medication list given to you today.   *If you need a refill on your cardiac medications before your next appointment, please call your pharmacy*   Lab Work:  CMET  AND LIPIDS  TODAY    If you have labs (blood work) drawn today and your tests are completely normal, you will receive your results only by: MyChart Message (if you have MyChart) OR A paper copy in the mail If you have any lab test that is abnormal or we need to change your treatment, we will call you to review the results.   Testing/Procedures: Your physician has recommended that you wear an event monitor. Event monitors are medical devices that record the heart's electrical activity. Doctors most often [place] these monitors to diagnose arrhythmias. Arrhythmias are problems with the speed or rhythm of the heartbeat. The monitor is a small, portable device. You can wear one while you do your normal daily activities. This is usually used to diagnose what is causing palpitations/syncope (passing out).    Follow-Up: At [facility], you and your health needs are our priority.  As part of our continuing mission to provide you with exceptional heart care, we have created designated Provider Care Teams.  These Care Teams include your primary Cardiologist (physician) and Advanced Practice Providers (APPs -  Physician Assistants and Nurse Practitioners) who all work together to provide you with the care you need, when you need it.  We recommend signing up for the patient portal called "MyChart".  Sign up information is provided on this After Visit Summary.  MyChart is used to connect with patients for Virtual Visits (Telemedicine).  Patients are able to view lab/test results, encounter notes, upcoming appointments, etc.  Non-urgent messages can be sent to your provider as well.   To learn more about what  you can do with MyChart, go to [email or web address].    Your next appointment:   4 month(s)  The format for your next appointment:   In Person  Provider:   You may see Dr. Ladona Ridgel  or one of the following Advanced Practice Providers on your designated Care Team:   Francis Dowse, [place] Casimiro Needle "Mardelle Matte" Lanna Poche, [place]    Other Instructions  Paige Oneill- Long Term Monitor Instructions  Your physician has requested you wear a ZIO patch monitor for  7- 14 days.  This is a single patch monitor. [employer] supplies one patch monitor per enrollment. Additional stickers are not available. Please do not apply patch if you will be having a Nuclear Stress Test,  Echocardiogram, Cardiac CT, MRI, or Chest Xray during the period you would be wearing the  monitor. The patch cannot be worn during these tests. You cannot remove and re-apply the  ZIO XT patch monitor.  Your ZIO patch monitor will be mailed 3 day USPS to your address on file. It may take 3-5 days  to receive your monitor after you have been enrolled.  Once you have received your monitor, please review the enclosed instructions. Your monitor  has already been registered assigning a specific monitor serial # to you.  Billing and Patient Assistance Program Information  We have supplied [employer] with any of your insurance information on file for billing purposes. [employer] offers a sliding scale Patient Assistance Program for patients that do not have  insurance, or whose insurance does not completely cover the cost of the ZIO monitor.  You must apply for the Patient Assistance Program to qualify for this discounted rate.  To apply, please call [employer] at [phone], select option 4, select option 2, ask to apply for  Patient Assistance Program. [name] will ask your household income, and how many people  are in your household. They will quote your out-of-pocket cost based on that information.  [employer] will also be able to set up a 50-month,  interest-free payment plan if needed.  Applying the monitor   Shave hair from upper left chest.  Hold abrader disc by orange tab. Rub abrader in 40 strokes over the upper left chest as  indicated in your monitor instructions.  Clean area with 4 enclosed alcohol pads. Let dry.  Apply patch as indicated in monitor instructions. Patch will be placed under collarbone on left  side of chest with arrow pointing upward.  Rub patch adhesive wings for 2 minutes. Remove white label marked "1". Remove the white  label marked "2". Rub patch adhesive wings for 2 additional minutes.  While looking in a mirror, press and release button in center of patch. A small green light will  flash 3-4 times. This will be your only indicator that the monitor has been turned on.  Do not shower for the first 24 hours. You may shower after the first 24 hours.  Press the button if you feel a symptom. You will hear a small click. Record Date, Time and  Symptom in the Patient Logbook.  When you are ready to remove the patch, follow instructions on the last 2 pages of Patient  Logbook. Stick patch monitor onto the last page of Patient Logbook.  Place Patient Logbook in the blue and white box. Use locking tab on box and tape box closed  securely. The blue and white box has prepaid postage on it. Please place it in the mailbox as  soon as possible. Your physician should have your test results approximately 7 days after the  monitor has been mailed back to [facility].  Call [facility] Customer Care at [phone] if you have questions regarding  your ZIO XT patch monitor. Call them immediately if you see an orange light blinking on your  monitor.  If your monitor falls off in less than 4 days, contact our Monitor department at [phone].  If your monitor becomes loose or falls off after 4 days call [employer] at [phone] for  suggestions on securing your monitor   Important Information About  [place]

## 2021-10-11 ENCOUNTER — Telehealth: Payer: Self-pay | Admitting: *Deleted

## 2021-10-11 NOTE — Telephone Encounter (Signed)
-----   Message from [facility], [place] sent at 10/11/2021  8:02 AM EDT ----- Lipids are uncontrolled. She needs to be on something for cholesterol.  She used to be on Crestor I think, why was that stopped?

## 2021-10-11 NOTE — Telephone Encounter (Signed)
Spoke with patient spouse and stated patient was sleep and took direct number to call me back.

## 2021-10-14 DIAGNOSIS — R002 Palpitations: Secondary | ICD-10-CM | POA: Diagnosis not present

## 2021-10-17 ENCOUNTER — Telehealth: Payer: Self-pay | Admitting: *Deleted

## 2021-10-17 NOTE — Telephone Encounter (Signed)
Patient received message from husband that Lipids were abnormal.  She would like a call back to discuss treatment.

## 2021-10-18 ENCOUNTER — Other Ambulatory Visit: Payer: Self-pay | Admitting: *Deleted

## 2021-10-18 DIAGNOSIS — Z79899 Other long term (current) drug therapy: Secondary | ICD-10-CM

## 2021-10-18 MED ORDER — ROSUVASTATIN CALCIUM 20 MG PO TABS: 20.0000 mg | ORAL_TABLET | Freq: Every day | ORAL | 3 refills | Status: DC

## 2022-01-15 ENCOUNTER — Other Ambulatory Visit: Payer: Self-pay | Admitting: Physician Assistant

## 2022-01-18 ENCOUNTER — Ambulatory Visit: Payer: 59 | Attending: Physician Assistant

## 2022-01-18 DIAGNOSIS — Z79899 Other long term (current) drug therapy: Secondary | ICD-10-CM

## 2022-01-18 LAB — HEPATIC FUNCTION PANEL
ALT: 16 IU/L (ref 0–32)
AST: 15 IU/L (ref 0–40)
Albumin: 4.3 g/dL (ref 3.8–4.9)
Alkaline Phosphatase: 121 IU/L (ref 44–121)
Bilirubin Total: 0.2 mg/dL (ref 0.0–1.2)
Bilirubin, Direct: <0.1 mg/dL (ref 0.00–0.40)
Total Protein: 6.8 g/dL (ref 6.0–8.5)

## 2022-01-18 LAB — LIPID PANEL
Chol/HDL Ratio: 5.4 ratio — ABNORMAL HIGH (ref 0.0–4.4)
Cholesterol, Total: 238 mg/dL — ABNORMAL HIGH (ref 100–199)
HDL: 44 mg/dL (ref >39)
LDL Chol Calc (NIH): 161 mg/dL — ABNORMAL HIGH (ref 0–99)
Triglycerides: 180 mg/dL — ABNORMAL HIGH (ref 0–149)
VLDL Cholesterol Cal: 33 mg/dL (ref 5–40)

## 2022-02-15 ENCOUNTER — Ambulatory Visit: Payer: 59 | Attending: Internal Medicine | Admitting: Internal Medicine

## 2022-02-15 ENCOUNTER — Encounter: Payer: Self-pay | Admitting: Internal Medicine

## 2022-02-15 VITALS — BP 108/78 | HR 65 | Ht 66.0 in | Wt 279.4 lb

## 2022-02-15 DIAGNOSIS — I456 Pre-excitation syndrome: Secondary | ICD-10-CM

## 2022-02-15 DIAGNOSIS — R002 Palpitations: Secondary | ICD-10-CM | POA: Diagnosis not present

## 2022-02-15 MED ORDER — METOPROLOL TARTRATE 25 MG PO TABS: 25.0000 mg | ORAL_TABLET | ORAL | 2 refills | Status: DC | PRN

## 2022-02-15 NOTE — Patient Instructions (Addendum)
Medication Instructions:  Your physician has recommended you make the following change in your medication: START TAKING: Metoprolol Tartrate ( 25 mg )  You will Take one tablet ( 25 mg ) by mouth AS NEEDED for palpitations.  In ONE HOUR if symptoms persist, You may repeat this dose, and Take one tablet ( 25 mg ) by mouth AS NEEDED.    Lab Work: None ordered.  If you have labs (blood work) drawn today and your tests are completely normal, you will receive your results only by: MyChart Message (if you have MyChart) OR A paper copy in the mail If you have any lab test that is abnormal or we need to change your treatment, we will call you to review the results.  Testing/Procedures: None ordered.  Follow-Up: At [facility], you and your health needs are our priority.  As part of our continuing mission to provide you with exceptional heart care, we have created designated Provider Care Teams.  These Care Teams include your primary Cardiologist (physician) and Advanced Practice Providers (APPs -  Physician Assistants and Nurse Practitioners) who all work together to provide you with the care you need, when you need it.  We recommend signing up for the patient portal called "MyChart".  Sign up information is provided on this After Visit Summary.  MyChart is used to connect with patients for Virtual Visits (Telemedicine).  Patients are able to view lab/test results, encounter notes, upcoming appointments, etc.  Non-urgent messages can be sent to your provider as well.   To learn more about what you can do with MyChart, go to [email or web address].    Your next appointment:   1 year(s)  The format for your next appointment:   In Person  Provider:   Lewayne Bunting, MD{or one of the following Advanced Practice Providers on your designated Care Team:   Francis Dowse, [place] Casimiro Needle "Mardelle Matte" [place], [place]  Metoprolol Tablets What is this medication? METOPROLOL (me TOE proe lole) treats high blood  pressure. It also prevents chest pain (angina) or further damage after a heart attack. It works by lowering your blood pressure and heart rate, making it easier for your heart to pump blood to the rest of your body. It belongs to a group of medications called beta blockers. This medicine may be used for other purposes; ask your health care provider or pharmacist if you have questions. COMMON BRAND NAME(S): Lopressor What should I tell my care team before I take this medication? They need to know if you have any of these conditions: Diabetes Heart or vessel disease like slow heart rate, worsening heart failure, heart block, sick sinus syndrome, or Raynaud's disease Kidney disease Liver disease Lung or breathing disease, like asthma or emphysema Pheochromocytoma Thyroid disease An unusual or allergic reaction to metoprolol, other beta blockers, medications, foods, dyes, or preservatives Pregnant or trying to get pregnant Breast-feeding How should I use this medication? Take this medication by mouth with water. Take it as directed on the prescription label at the same time every day. You can take it with or without food. You should always take it the same way. Keep taking it unless your care team tells you to stop. Talk to your care team about the use of this medication in children. Special care may be needed. Overdosage: If you think you have taken too much of this medicine contact a poison control center or emergency room at once. NOTE: This medicine is only for you. Do not share this medicine  with others. What if I miss a dose? If you miss a dose, take it as soon as you can. If it is almost time for your next dose, take only that dose. Do not take double or extra doses. What may interact with this medication? This medication may interact with the following: Certain medications for blood pressure, heart disease, irregular heartbeat Certain medications for depression like monoamine oxidase  (MAO) inhibitors, fluoxetine, or paroxetine Clonidine Dobutamine Epinephrine Isoproterenol Reserpine This list may not describe all possible interactions. Give your health care provider a list of all the medicines, herbs, non-prescription drugs, or dietary supplements you use. Also tell them if you smoke, drink alcohol, or use illegal drugs. Some items may interact with your medicine. What should I watch for while using this medication? Visit your care team for regular checks on your progress. Check your blood pressure as directed. Ask your care team what your blood pressure should be. Also, find out when you should contact them. Do not treat yourself for coughs, colds, or pain while you are using this medication without asking your care team for advice. Some medications may increase your blood pressure. You may get drowsy or dizzy. Do not drive, use machinery, or do anything that needs mental alertness until you know how this medication affects you. Do not stand up or sit up quickly, especially if you are an older patient. This reduces the risk of dizzy or fainting spells. Alcohol may interfere with the effect of this medication. Avoid alcoholic drinks. This medication may increase blood sugar. Ask your care team if changes in diet or medications are needed if you have diabetes. What side effects may I notice from receiving this medication? Side effects that you should report to your care team as soon as possible: Allergic reactions--skin rash, itching, hives, swelling of the face, lips, tongue, or throat Heart failure--shortness of breath, swelling of the ankles, feet, or hands, sudden weight gain, unusual weakness or fatigue Low blood pressure--dizziness, feeling faint or lightheaded, blurry vision Raynaud's--cool, numb, or painful fingers or toes that may change color from pale, to blue, to red Slow heartbeat--dizziness, feeling faint or lightheaded, confusion, trouble breathing, unusual  weakness or fatigue Worsening mood, feelings of depression Side effects that usually do not require medical attention (report to your care team if they continue or are bothersome): Change in sex drive or performance Diarrhea Dizziness Fatigue Headache This list may not describe all possible side effects. Call your doctor for medical advice about side effects. You may report side effects to FDA at 1-800-FDA-1088. Where should I keep my medication? Keep out of the reach of children and pets. Store at room temperature between 15 and 30 degrees C (59 and 86 degrees F). Protect from moisture. Keep the container tightly closed. Throw away any unused medication after the expiration date. NOTE: This sheet is a summary. It may not cover all possible information. If you have questions about this medicine, talk to your doctor, pharmacist, or health care provider.  2023 Elsevier/Gold Standard (2020-06-10 00:00:00)

## 2022-02-15 NOTE — Progress Notes (Signed)
HPI Paige Oneill returns today for followup. 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 She has done well with medical therapy. She wore a cardiac monitor several months ago. This demonstrated NS SVT. She has non-cardiac chest pain. She still has some days when her heart races but these are infrequent. Allergies  Allergen Reactions   Penicillins     "Severe reaction as a child"     Current Outpatient Medications  Medication Sig Dispense Refill   Prenatal Vit-Fe Fumarate-FA (PRENATAL VITAMIN PO) Take by mouth daily.     rosuvastatin (CRESTOR) 20 MG tablet TAKE 1 TABLET BY MOUTH EVERY DAY 90 tablet 2   Current Facility-Administered Medications  Medication Dose Route Frequency Provider Last Rate Last Admin   0.9 %  sodium chloride infusion  500 mL Intravenous Continuous Ladene Artist, MD         Past Medical History:  Diagnosis Date   Allergy    Anemia    GERD (gastroesophageal reflux disease)    occ   Palpitations    Wolff-Parkinson-White (WPW) syndrome     ROS:   All systems reviewed and negative except as noted in the HPI.   Past Surgical History:  Procedure Laterality Date   APPENDECTOMY  1988   LEEP     NSVD     x3   TUBAL LIGATION       Family History  Problem Relation Age of Onset   Hypertension Mother    Diabetes Mother    Aneurysm Mother    Cancer Mother    Colon cancer Maternal Grandmother    Heart murmur Daughter    Colon cancer Brother 45       dx'd 6 months ago as of 01-04-17    Liver cancer Brother        mets from colon    Lung cancer Brother        mets from colon    Thyroid cancer Maternal Grandfather    Colon cancer Other    Colon polyps Neg Hx    Esophageal cancer Neg Hx      Social History   Socioeconomic History   Marital status: Married    Spouse name: Not on file   Number of children: Not on file    Years of education: Not on file   Highest education level: Not on file  Occupational History   Not on file  Tobacco Use   Smoking status: Never   Smokeless tobacco: Never  Vaping Use   Vaping Use: Never used  Substance and Sexual Activity   Alcohol use: Yes    Alcohol/week: 2.0 standard drinks of alcohol    Types: 2 Standard drinks or equivalent per week    Comment: socially   Drug use: No   Sexual activity: Yes    Partners: Male  Other Topics Concern   Not on file  Social History Narrative   Not on file   Social Determinants of Health   Financial Resource Strain: Not on file  Food Insecurity: Not on file  Transportation Needs: Not on file  Physical Activity: Not on file  Stress: Not on file  Social Connections: Not on file  Intimate Partner Violence: Not on file     BP 108/78   Pulse 65   Ht 5'  6" (1.676 m)   Wt 279 lb 6.4 oz (126.7 kg)   LMP  (LMP Unknown)   SpO2 98%   BMI 45.10 kg/m   Physical Exam:  Well appearing obese woman, NAD HEENT: Unremarkable Neck:  No JVD, no thyromegally Lymphatics:  No adenopathy Back:  No CVA tenderness Lungs:  Clear with no wheezes HEART:  Regular rate rhythm, no murmurs, no rubs, no clicks Abd:  soft, positive bowel sounds, no organomegally, no rebound, no guarding Ext:  2 plus pulses, no edema, no cyanosis, no clubbing Skin:  No rashes no nodules Neuro:  CN II through XII intact, motor grossly intact   Assess/Plan:  SVT/WPW - her symptoms are well controlled and her heart monitor is reassuring. She will undergo watchful waiting and I asked her to take prn metoprolol and a prescription has been given. Obesity - she is encouraged to lose weight.  Paige Oneill Paige Remmers,MD

## 2022-04-12 ENCOUNTER — Ambulatory Visit
Admission: EM | Admit: 2022-04-12 | Discharge: 2022-04-12 | Disposition: A | Discharge status: Home / Self Care | Payer: 59

## 2022-04-12 ENCOUNTER — Encounter: Payer: Self-pay | Admitting: Emergency Medicine

## 2022-04-12 DIAGNOSIS — R21 Rash and other nonspecific skin eruption: Secondary | ICD-10-CM | POA: Diagnosis not present

## 2022-04-12 MED ORDER — DEXAMETHASONE SODIUM PHOSPHATE 10 MG/ML IJ SOLN
10.0000 mg | INTRAMUSCULAR | Status: AC
  Administered 2022-04-12: 10 mg via INTRAMUSCULAR

## 2022-04-12 MED ORDER — TRIAMCINOLONE ACETONIDE 0.1 % EX CREA: 1.0000 | TOPICAL_CREAM | Freq: Two times a day (BID) | CUTANEOUS | 0 refills | Status: DC

## 2022-04-12 MED ORDER — PREDNISONE 50 MG PO TABS: ORAL_TABLET | ORAL | 0 refills | Status: AC

## 2022-04-12 MED ORDER — FAMOTIDINE 20 MG PO TABS: 20.0000 mg | ORAL_TABLET | Freq: Every day | ORAL | 0 refills | Status: AC

## 2022-04-12 NOTE — ED Triage Notes (Signed)
Rash on face , neck and chest and abd x 2 days.  Itchy rash, now rash burns.  Has been using Triamcinolone cream, but is out of the cream, states this cream did help.

## 2022-04-12 NOTE — ED Provider Notes (Signed)
[facility]    CSN: [id number] Arrival date & time: 04/12/22  1625      History   Chief Complaint No chief complaint on file.   HPI Paige Oneill is a 56 y.o. female.   The history is provided by the patient.   Patient presents with a 2-day history of rash.  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  She states that this has happened in the past several years ago.  She states the rash started on her hand and has since spread to her neck under her breast, her chest, her back, and bilateral groin region.  She states the rash is itchy.  She denies oozing or drainage from the rash.  Patient denies any other new exposures to soaps, medications, foods, or detergents.  Patient states that she did have an old prescription of triamcinolone cream that she was using, but she ran out.  She states she has also been taking Claritin and Benadryl.  Past Medical History:  Diagnosis Date   Allergy    Anemia    GERD (gastroesophageal reflux disease)    occ   Palpitations    Wolff-Parkinson-White (WPW) syndrome     Patient Active Problem List   Diagnosis Date Noted   SVT (supraventricular tachycardia) 07/12/2020   Chronic pain of left knee 03/27/2017   Plantar fasciitis of left foot 02/27/2017   Morbid obesity ([place]) 03/18/2013   WPW syndrome 11/11/2012   Dyspnea 11/11/2012   Palpitations 11/11/2012    Past Surgical History:  Procedure Laterality Date   APPENDECTOMY  1988   LEEP     NSVD     x3   TUBAL LIGATION      OB History   No obstetric history on file.      Home Medications    Prior to Admission medications   Medication Sig Start Date End Date Taking? Authorizing Provider  famotidine (PEPCID) 20 MG tablet Take 1 tablet (20 mg total) by mouth daily. 04/12/22  Yes Ashyla Luth-Warren, Alda Lea, NP  predniSONE (DELTASONE) 50 MG tablet Take one tablet daily with breakfast  for the next 5 days. 04/12/22  Yes Damontae Loppnow-Warren, Alda Lea, NP  triamcinolone cream (KENALOG) 0.1 % Apply 1 Application topically 2 (two) times daily. 04/12/22  Yes Carter Kassel-Warren, Alda Lea, NP  metoprolol tartrate (LOPRESSOR) 25 MG tablet Take 1 tablet (25 mg total) by mouth as needed. In ONE HOUR, you may repeat this dose: Take 1 tablet ( 25 mg ) by mouth AS NEEDED for palpitations. 02/15/22   Evans Lance, MD  Prenatal Vit-Fe Fumarate-FA (PRENATAL VITAMIN PO) Take by mouth daily.    [provider]  rosuvastatin (CRESTOR) 20 MG tablet TAKE 1 TABLET BY MOUTH EVERY DAY 01/15/22   Baldwin Jamaica, PA-C    Family History Family History  Problem Relation Age of Onset   Hypertension Mother    Diabetes Mother    Aneurysm Mother    Cancer Mother    Colon cancer Maternal Grandmother    Heart murmur Daughter    Colon cancer Brother 23       dx'd 6 months ago as of 01-04-17    Liver cancer Brother        mets from colon    Lung cancer Brother        mets from colon    Thyroid cancer Maternal  Grandfather    Colon cancer Other    Colon polyps Neg Hx    Esophageal cancer Neg Hx     Social History Social History   Tobacco Use   Smoking status: Never   Smokeless tobacco: Never  Vaping Use   Vaping Use: Never used  Substance Use Topics   Alcohol use: Yes    Alcohol/week: 2.0 standard drinks of alcohol    Types: 2 Standard drinks or equivalent per week    Comment: socially   Drug use: No     Allergies   Penicillins   Review of Systems Review of Systems Per HPI  Physical Exam Triage Vital Signs ED Triage Vitals  Enc Vitals Group     BP 04/12/22 1710 (!) 168/94     Pulse Rate 04/12/22 1710 83     Resp 04/12/22 1710 18     Temp 04/12/22 1710 98.2 F (36.8 C)     Temp Source 04/12/22 1710 Oral     SpO2 04/12/22 1710 99 %     Weight --      Height --      Head Circumference --      Peak Flow --      Pain Score 04/12/22 1713 5     Pain Loc --      Pain  Edu? --      Excl. in GC? --    No data found.  Updated Vital Signs BP (!) 168/94 (BP Location: Right Arm)   Pulse 83   Temp 98.2 F (36.8 C) (Oral)   Resp 18   LMP  (LMP Unknown)   SpO2 99%   Visual Acuity Right Eye Distance:   Left Eye Distance:   Bilateral Distance:    Right Eye Near:   Left Eye Near:    Bilateral Near:     Physical Exam Vitals and nursing note reviewed.  Constitutional:      General: She is not in acute distress.    Appearance: Normal appearance.  HENT:     Head: Normocephalic.  Eyes:     Extraocular Movements: Extraocular movements intact.     Pupils: Pupils are equal, round, and reactive to light.  Cardiovascular:     Pulses: Normal pulses.     Heart sounds: Normal heart sounds.  Pulmonary:     Effort: Pulmonary effort is normal.     Breath sounds: Normal breath sounds.  Abdominal:     General: Bowel sounds are normal.     Palpations: Abdomen is soft.  Musculoskeletal:     Cervical back: Normal range of motion.  Lymphadenopathy:     Cervical: No cervical adenopathy.  Skin:    General: Skin is warm.     Findings: Rash present. Rash is macular and papular.     Comments: Maculopapular rash noted to the left hand, right neck, left chest, underneath the bilateral breasts, and bilateral forearm region.  Rash with areas of hypopigmentation.  Rash is dry, and scaling, with erythematous raised borders.  There is no oozing, fluctuance, or drainage present.  Neurological:     General: No focal deficit present.     Mental Status: She is alert and oriented to person, place, and time.  Psychiatric:        Mood and Affect: Mood normal.      UC Treatments / Results  Labs (all labs ordered are listed, but only abnormal results are displayed) Labs Reviewed - No data to display  EKG  Radiology No results found.  Procedures Procedures (including critical care time)  Medications Ordered in UC Medications  dexamethasone (DECADRON) injection  10 mg (10 mg Intramuscular Given 04/12/22 1748)    Initial Impression / Assessment and Plan / UC Course  I have reviewed the triage vital signs and the nursing notes.  Pertinent labs & imaging results that were available during my care of the patient were reviewed by me and considered in my medical decision making (see chart for details).  The patient is well-appearing, she is in no acute distress, vital signs are stable.  Symptoms are consistent with possible contact dermatitis.  Patient with diffuse areas of rash on her body surface area.  Rash is maculopapular in nature.  Decadron 10 mg IM given in the clinic.  Will start patient on prednisone 50 mg for the rash and inflammation, triamcinolone 0.1% cream for topical application to help with inflammation, and Pepcid AC to help with the rash.  Supportive care recommendations were also provided to the patient to include the use of Aveeno colloidal oatmeal bath, and avoidance of hot baths or showers.  Patient verbalizes understanding.  All questions were answered.  Patient is stable for discharge.  Patient was advised to follow-up with her primary care physician if symptoms fail to improve.   Final Clinical Impressions(s) / UC Diagnoses   Final diagnoses:  Rash and nonspecific skin eruption     Discharge Instructions      Take medication as prescribed. Continue Claritin and Benadryl as needed to help with itching. Avoid hot baths or showers while symptoms persist.  Recommend taking lukewarm baths. May apply cool cloths to the area to help with itching or discomfort. Avoid scratching, rubbing, or manipulating the areas while symptoms persist. Recommend Aveeno colloidal oatmeal bath to use to help with drying and itching. If symptoms fail to improve, please follow-up with your primary care physician for further evaluation.     ED Prescriptions     Medication Sig Dispense Auth. Provider   predniSONE (DELTASONE) 50 MG tablet Take one  tablet daily with breakfast for the next 5 days. 5 tablet Opel Lejeune-Warren, Alda Lea, NP   triamcinolone cream (KENALOG) 0.1 % Apply 1 Application topically 2 (two) times daily. 453.6 g Lindley Hiney-Warren, Alda Lea, NP   famotidine (PEPCID) 20 MG tablet Take 1 tablet (20 mg total) by mouth daily. 30 tablet Amaya Blakeman-Warren, Alda Lea, NP      PDMP not reviewed this encounter.   Tish Men, NP 04/12/22 1750

## 2022-04-12 NOTE — Discharge Instructions (Addendum)
Take medication as prescribed. Continue Claritin and Benadryl as needed to help with itching. Avoid hot baths or showers while symptoms persist.  Recommend taking lukewarm baths. May apply cool cloths to the area to help with itching or discomfort. Avoid scratching, rubbing, or manipulating the areas while symptoms persist. Recommend Aveeno colloidal oatmeal bath to use to help with drying and itching. If symptoms fail to improve, please follow-up with your primary care physician for further evaluation.

## 2022-05-15 ENCOUNTER — Encounter: Payer: Self-pay | Admitting: Gastroenterology

## 2022-05-15 IMAGING — CT CT ABD-PELV W/ CM
2 of 5 series · 16 of 46 positions shown, 18 images · IV contrast (iopamidol)
Comparison: None.

CLINICAL DATA: LEFT lower quadrant pain.  2-3 weeks of pain.

EXAM:
CT ABDOMEN AND PELVIS WITH CONTRAST
TECHNIQUE: Multidetector CT imaging of the abdomen and pelvis was performed
using the standard protocol following bolus administration of
intravenous contrast.
CONTRAST:  100mL [id number] IOPAMIDOL (CQJGZ7-[id number]) INJECTION 61%

[Series 2: abd pelvis 5.00 br40 s3 axial · axial · 0.90mm/px · z∈[+1097,+1512]mm · 13 of 95 slices shown, 15 images]
[im 6/95  soft-tissue]
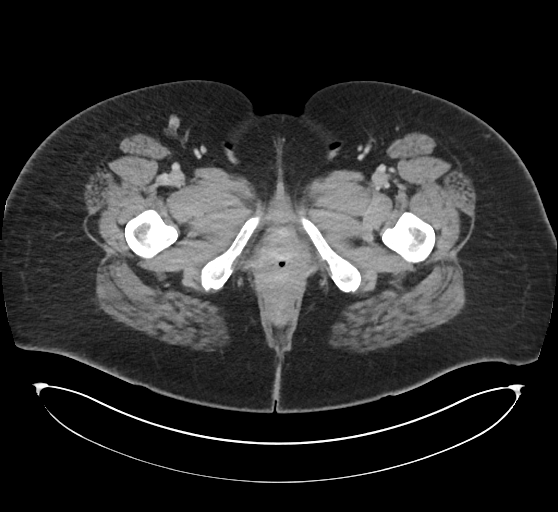
[im 6/95  bone]
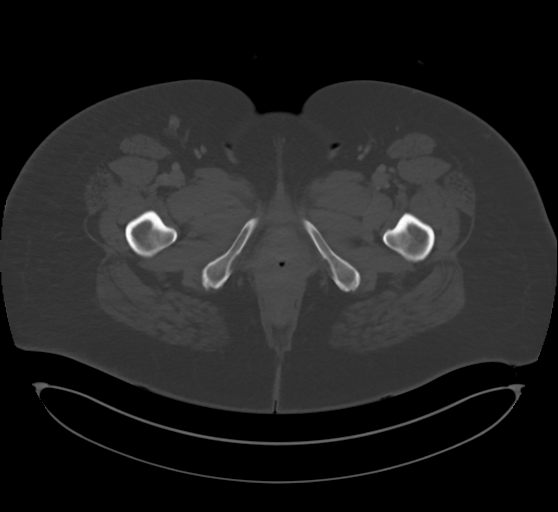
[im 11/95  soft-tissue]
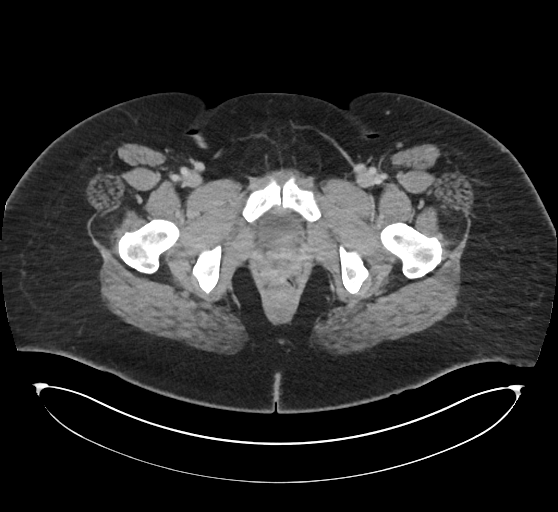
[im 21/95  soft-tissue]
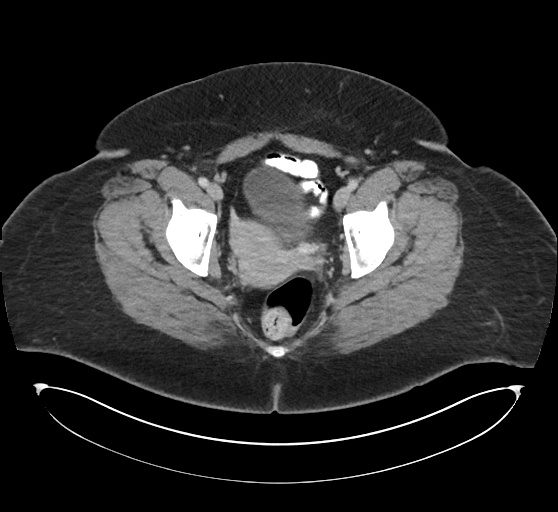
[im 27/95  soft-tissue]
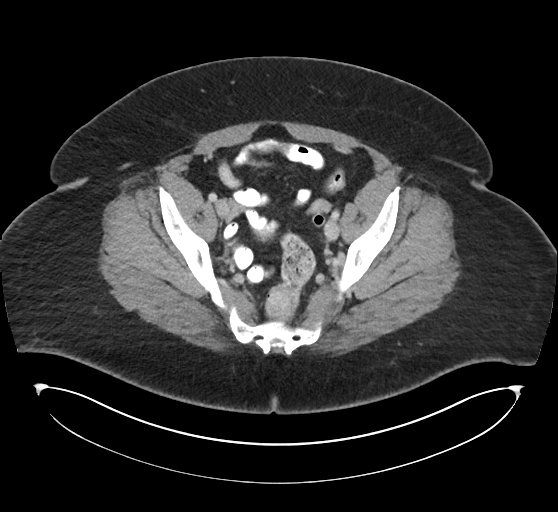
[im 32/95  soft-tissue]
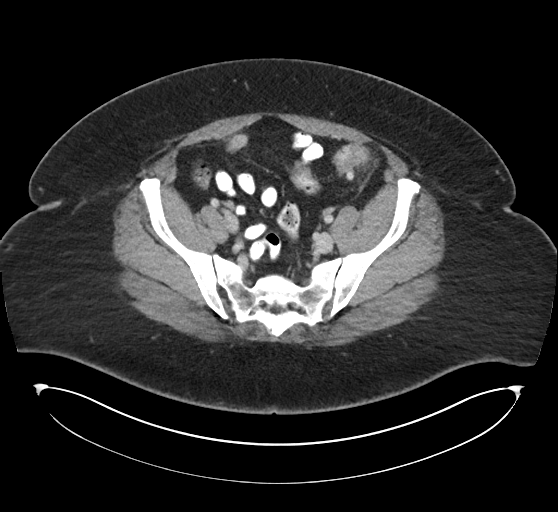
[im 42/95  soft-tissue]
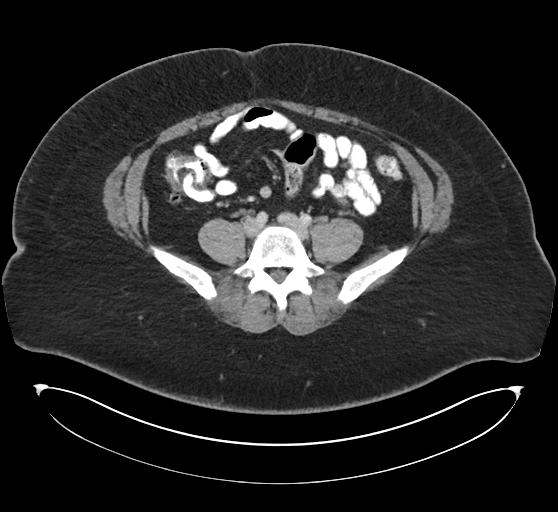
[im 48/95  soft-tissue]
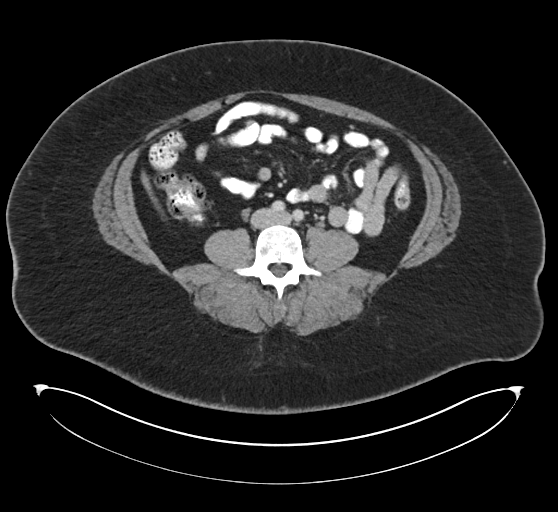
[im 53/95  soft-tissue]
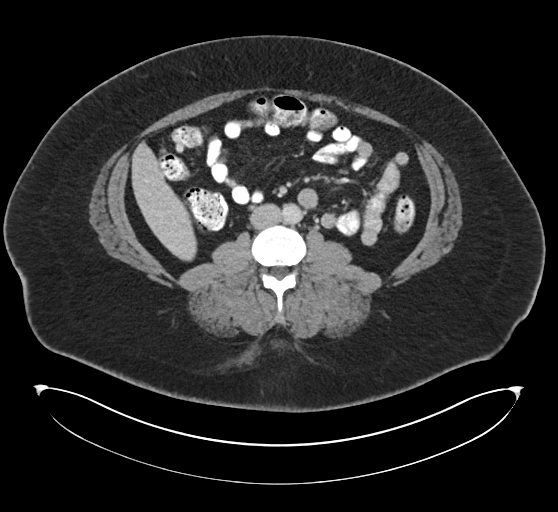
[im 63/95  soft-tissue]
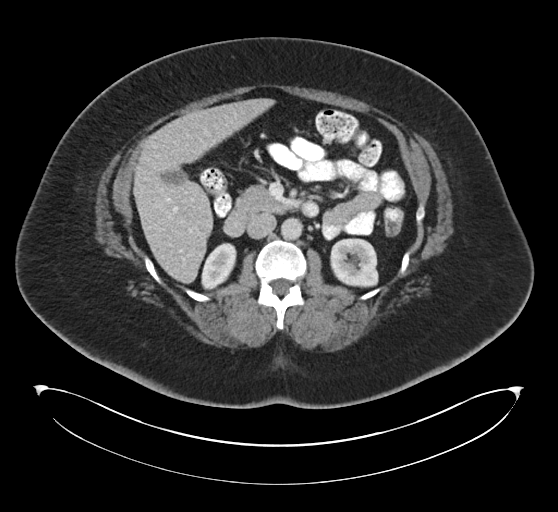
[im 63/95  bone]
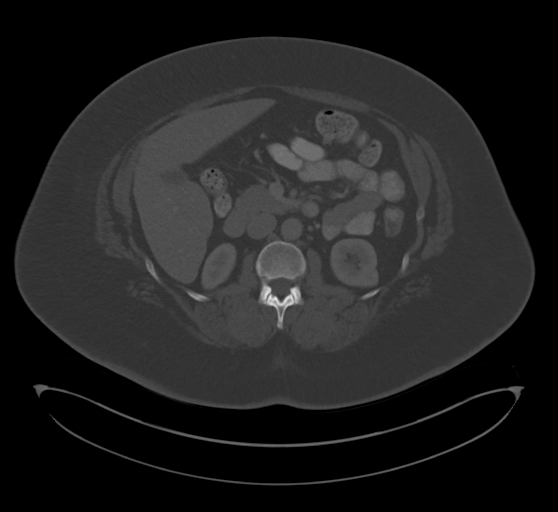
[im 68/95  soft-tissue]
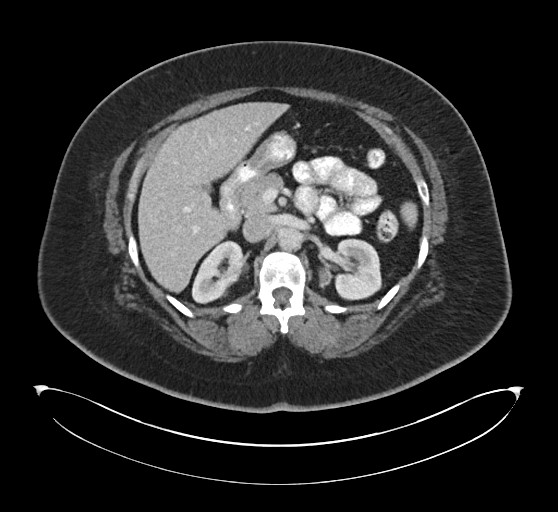
[im 74/95  soft-tissue]
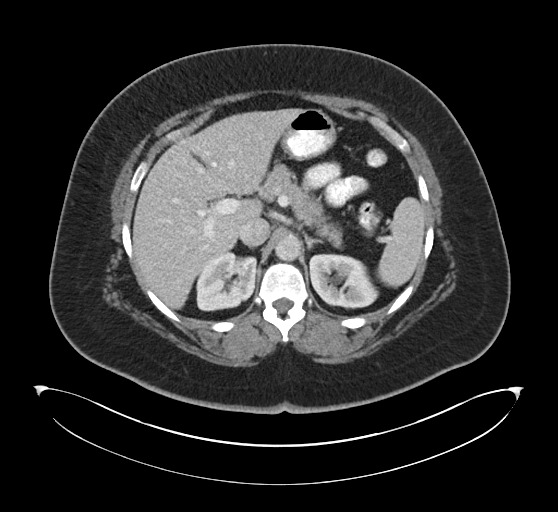
[im 84/95  soft-tissue]
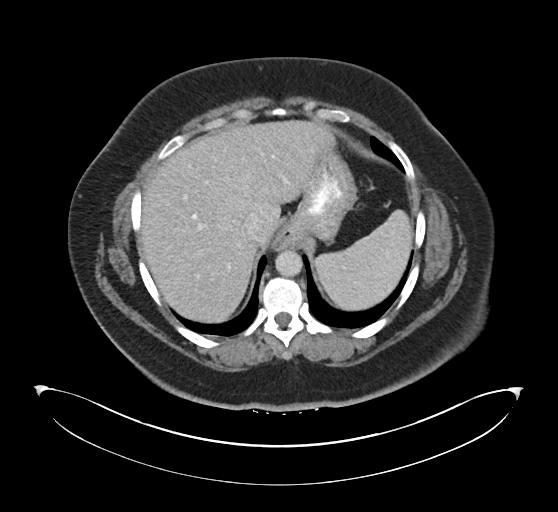
[im 89/95  soft-tissue]
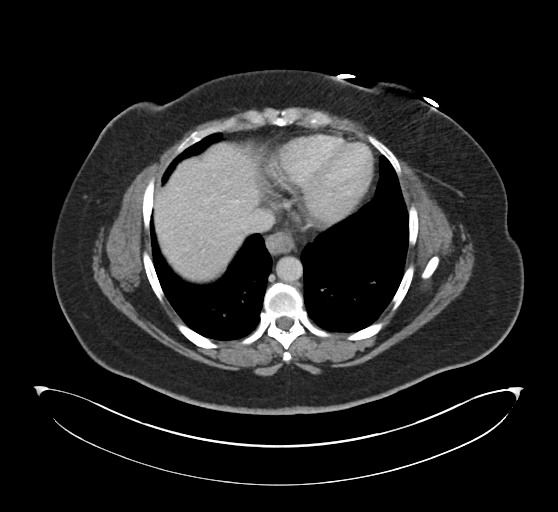

[Series 6: abd pelvis 2.00 br40 s3 cor · coronal · 0.94mm/px · 3 of 172 slices shown]
[im 58/172  soft-tissue]
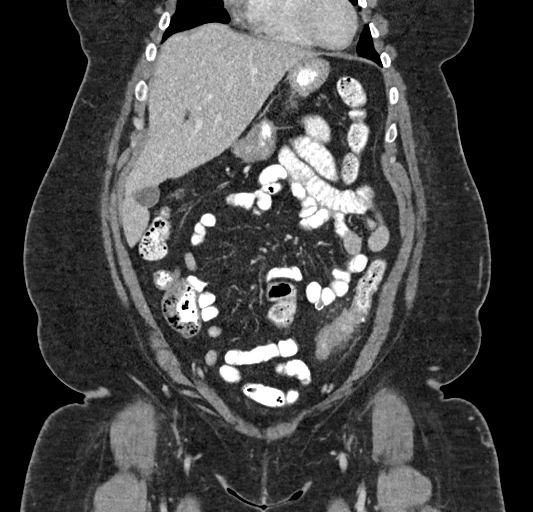
[im 77/172  soft-tissue]
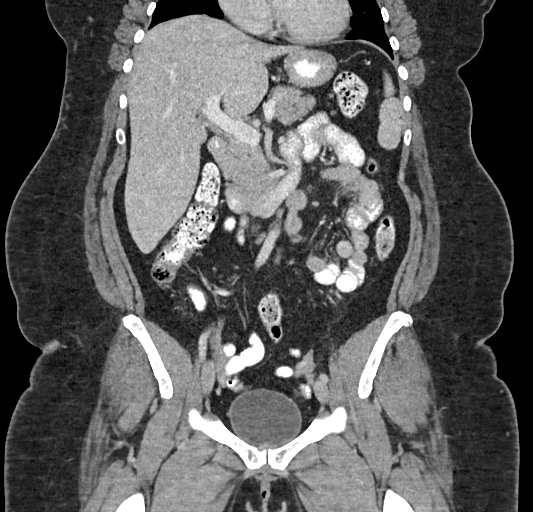
[im 96/172  soft-tissue]
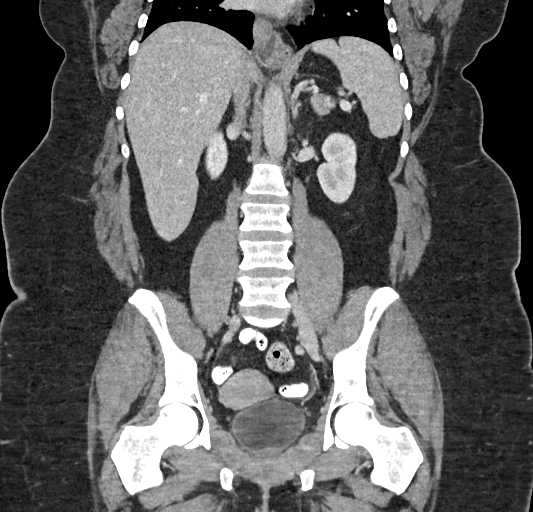

[16 of 46 positions shown; findings below may reference images not displayed]

FINDINGS: Lower chest: Mosaic perfusion to the lung bases suggest atelectasis.

Hepatobiliary: No focal hepatic lesion. No biliary duct dilatation.
Common bile duct is normal.

Pancreas: Pancreas is normal. No ductal dilatation. No pancreatic
inflammation.

Spleen: Normal spleen

Adrenals/urinary tract: Adrenal glands and kidneys are normal. The
ureters and bladder normal.

Stomach/Bowel: Moderate hiatal hernia. Stomach, duodenum and
small-bowel are normal. Appendix not identified. Ascending,
transverse and proximal descending colon normal.

Within the distal aspect of the descending colon there is
pericolonic fat inflammation. Mild thickening of the a peritoneal
reflection this level (image 63/2). There are several diverticula
through this region. Findings most consistent acute diverticulitis.
No evidence of perforation or abscess.

The more distal sigmoid colon rectum appear normal

Vascular/Lymphatic: Abdominal aorta is normal caliber. No periportal
or retroperitoneal adenopathy. No pelvic adenopathy.

Reproductive: Uterus and adnexa unremarkable.

Other: No free fluid.

Musculoskeletal: No aggressive osseous lesion.
IMPRESSION: 1. Acute non complicated diverticulitis in distal aspect of the
descending colon. No perforation or abscess.
2. Moderate hiatal hernia.

These results will be called to the ordering clinician or
representative by the Radiologist Assistant, and communication
documented in the PACS or [REDACTED].

## 2022-05-25 ENCOUNTER — Other Ambulatory Visit: Payer: Self-pay | Admitting: Internal Medicine

## 2023-02-25 ENCOUNTER — Ambulatory Visit
Admission: EM | Admit: 2023-02-25 | Discharge: 2023-02-25 | Disposition: A | Discharge status: Home / Self Care | Payer: 59

## 2023-02-25 DIAGNOSIS — R21 Rash and other nonspecific skin eruption: Secondary | ICD-10-CM | POA: Diagnosis not present

## 2023-02-25 DIAGNOSIS — H6992 Unspecified Eustachian tube disorder, left ear: Secondary | ICD-10-CM

## 2023-02-25 MED ORDER — FLUTICASONE PROPIONATE 50 MCG/ACT NA SUSP: 1.0000 | Freq: Two times a day (BID) | NASAL | 2 refills | Status: AC

## 2023-02-25 MED ORDER — TRIAMCINOLONE ACETONIDE 0.1 % EX CREA: 1.0000 | TOPICAL_CREAM | Freq: Two times a day (BID) | CUTANEOUS | 0 refills | Status: AC | PRN

## 2023-02-25 MED ORDER — PREDNISONE 50 MG PO TABS: ORAL_TABLET | ORAL | 0 refills | Status: AC

## 2023-02-25 NOTE — ED Triage Notes (Signed)
Pt c/o left earache, and a headache, left side facial pain  x 1 week

## 2023-02-25 NOTE — ED Provider Notes (Signed)
[facility]    CSN: [id number] Arrival date & time: 02/25/23  1718      History   Chief Complaint No chief complaint on file.   HPI Paige Oneill is a 57 y.o. female.   Presenting today with 1 week history of left ear pain and pressure, left-sided headache.  Denies drainage from the ear, fever, congestion, cough.  Does have a history of seasonal allergies but states it has not been seemingly bothering her recently so she has not been taking anything for it.  Trying Tylenol with mild temporary benefit.  She also is requesting a refill on triamcinolone for periodic rashes that she has.    Past Medical History:  Diagnosis Date   Allergy    Anemia    GERD (gastroesophageal reflux disease)    occ   Palpitations    Wolff-Parkinson-White (WPW) syndrome     Patient Active Problem List   Diagnosis Date Noted   SVT (supraventricular tachycardia) (HCC) 07/12/2020   Chronic pain of left knee 03/27/2017   Plantar fasciitis of left foot 02/27/2017   Morbid obesity (HCC) 03/18/2013   WPW syndrome 11/11/2012   Dyspnea 11/11/2012   Palpitations 11/11/2012    Past Surgical History:  Procedure Laterality Date   APPENDECTOMY  1988   LEEP     NSVD     x3   TUBAL LIGATION      OB History   No obstetric history on file.      Home Medications    Prior to Admission medications   Medication Sig Start Date End Date Taking? Authorizing Provider  fluticasone (FLONASE) 50 MCG/ACT nasal spray Place 1 spray into both nostrils 2 (two) times daily. 02/25/23  Yes Particia Nearing, PA-C  nitroGLYCERIN (NITROSTAT) 0.4 MG SL tablet Place 0.4 mg under the tongue every 5 (five) minutes as needed for chest pain.   Yes [provider]  predniSONE (DELTASONE) 50 MG tablet Take 1 tab daily with breakfast for 3 days 02/25/23  Yes Particia Nearing, PA-C  famotidine (PEPCID) 20 MG tablet Take 1 tablet (20 mg total) by mouth daily. 04/12/22   Leath-Warren, Sadie Haber, NP  metoprolol tartrate (LOPRESSOR) 25 MG tablet TAKE 1 TABLET BY MOUTH AS NEEDED. IN ONE HOUR, YOU MAY REPEAT THIS DOSE AS NEEDED FOR PALPITATIONS. 05/25/22   Marinus Maw, MD  predniSONE (DELTASONE) 50 MG tablet Take one tablet daily with breakfast for the next 5 days. 04/12/22   Leath-Warren, Sadie Haber, NP  Prenatal Vit-Fe Fumarate-FA (PRENATAL VITAMIN PO) Take by mouth daily.    [provider]  rosuvastatin (CRESTOR) 20 MG tablet TAKE 1 TABLET BY MOUTH EVERY DAY 01/15/22   Sheilah Pigeon, PA-C  triamcinolone cream (KENALOG) 0.1 % Apply 1 Application topically 2 (two) times daily as needed. 02/25/23   Particia Nearing, PA-C    Family History Family History  Problem Relation Age of Onset   Hypertension Mother    Diabetes Mother    Aneurysm Mother    Cancer Mother    Colon cancer Maternal Grandmother    Heart murmur Daughter    Colon cancer Brother 64       dx'd 6 months ago as of 01-04-17    Liver cancer Brother        mets from colon    Lung cancer Brother        mets from colon    Thyroid cancer Maternal Grandfather    Colon cancer  Other    Colon polyps Neg Hx    Esophageal cancer Neg Hx     Social History Social History   Tobacco Use   Smoking status: Never   Smokeless tobacco: Never  Vaping Use   Vaping status: Never Used  Substance Use Topics   Alcohol use: Yes    Alcohol/week: 2.0 standard drinks of alcohol    Types: 2 Standard drinks or equivalent per week    Comment: socially   Drug use: No     Allergies   Penicillins   Review of Systems Review of Systems Per HPI  Physical Exam Triage Vital Signs ED Triage Vitals  Encounter Vitals Group     BP 02/25/23 1729 (!) 156/94     Systolic BP Percentile --      Diastolic BP Percentile --      Pulse Rate 02/25/23 1729 68     Resp 02/25/23 1729 17     Temp 02/25/23 1729 98.2 F (36.8 C)     Temp Source 02/25/23 1729 Oral     SpO2 02/25/23 1729 98 %     Weight --      Height --       Head Circumference --      Peak Flow --      Pain Score 02/25/23 1731 8     Pain Loc --      Pain Education --      Exclude from Growth Chart --    No data found.  Updated Vital Signs BP (!) 156/94 (BP Location: Right Arm)   Pulse 68   Temp 98.2 F (36.8 C) (Oral)   Resp 17   LMP  (LMP Unknown)   SpO2 98%   Visual Acuity Right Eye Distance:   Left Eye Distance:   Bilateral Distance:    Right Eye Near:   Left Eye Near:    Bilateral Near:     Physical Exam Vitals and nursing note reviewed.  Constitutional:      Appearance: Normal appearance. She is not ill-appearing.  HENT:     Head: Atraumatic.     Ears:     Comments: Left middle ear effusion with TM injection    Mouth/Throat:     Mouth: Mucous membranes are moist.     Pharynx: Oropharynx is clear.  Eyes:     Extraocular Movements: Extraocular movements intact.     Conjunctiva/sclera: Conjunctivae normal.  Cardiovascular:     Rate and Rhythm: Normal rate and regular rhythm.     Heart sounds: Normal heart sounds.  Pulmonary:     Effort: Pulmonary effort is normal.     Breath sounds: Normal breath sounds.  Musculoskeletal:        General: Normal range of motion.     Cervical back: Normal range of motion and neck supple.  Skin:    General: Skin is warm and dry.  Neurological:     Mental Status: She is alert and oriented to person, place, and time.  Psychiatric:        Mood and Affect: Mood normal.        Thought Content: Thought content normal.        Judgment: Judgment normal.      UC Treatments / Results  Labs (all labs ordered are listed, but only abnormal results are displayed) Labs Reviewed - No data to display  EKG   Radiology No results found.  Procedures Procedures (including critical care time)  Medications Ordered  in UC Medications - No data to display  Initial Impression / Assessment and Plan / UC Course  I have reviewed the triage vital signs and the nursing  notes.  Pertinent labs & imaging results that were available during my care of the patient were reviewed by me and considered in my medical decision making (see chart for details).     Triamcinolone refilled, will treat with Flonase, decongestants, antihistamines for eustachian tube dysfunction and if not improving may take prednisone.  Return for worsening symptoms.  Final Clinical Impressions(s) / UC Diagnoses   Final diagnoses:  Acute dysfunction of left eustachian tube  Rash   Discharge Instructions   None    ED Prescriptions     Medication Sig Dispense Auth. Provider   triamcinolone cream (KENALOG) 0.1 % Apply 1 Application topically 2 (two) times daily as needed. 453.6 g Particia Nearing, PA-C   predniSONE (DELTASONE) 50 MG tablet Take 1 tab daily with breakfast for 3 days 3 tablet Particia Nearing, PA-C   fluticasone [facility]) 50 MCG/ACT nasal spray Place 1 spray into both nostrils 2 (two) times daily. 16 g Particia Nearing, [place]      PDMP not reviewed this encounter.   Particia Nearing, [place] 02/25/23 1806

## 2023-05-23 ENCOUNTER — Other Ambulatory Visit: Payer: Self-pay | Admitting: Family Medicine

## 2023-11-26 ENCOUNTER — Ambulatory Visit: Attending: Internal Medicine | Admitting: Internal Medicine

## 2023-11-26 ENCOUNTER — Encounter: Payer: Self-pay | Admitting: Internal Medicine

## 2023-11-26 VITALS — BP 142/80 | HR 63 | Ht 66.0 in | Wt 269.8 lb

## 2023-11-26 DIAGNOSIS — R002 Palpitations: Secondary | ICD-10-CM

## 2023-11-26 NOTE — Progress Notes (Signed)
 HPI Paige Oneill returns today for followup. 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 She has done well with medical therapy. She wore a cardiac monitor a couple of years ago. This demonstrated NS SVT. She has non-cardiac chest pain. She still has some days when her heart races but these are infrequent. She takes lopressor  and things get better.  Allergies  Allergen Reactions   Penicillins     Severe reaction as a child     Current Outpatient Medications  Medication Sig Dispense Refill   famotidine  (PEPCID ) 20 MG tablet Take 1 tablet (20 mg total) by mouth daily. 30 tablet 0   fluticasone  (FLONASE ) 50 MCG/ACT nasal spray Place 1 spray into both nostrils 2 (two) times daily. 16 g 2   metoprolol  tartrate (LOPRESSOR ) 25 MG tablet TAKE 1 TABLET BY MOUTH AS NEEDED. IN ONE HOUR, YOU MAY REPEAT THIS DOSE AS NEEDED FOR PALPITATIONS. 180 tablet 0   nitroGLYCERIN  (NITROSTAT ) 0.4 MG SL tablet Place 0.4 mg under the tongue every 5 (five) minutes as needed for chest pain.     Prenatal Vit-Fe Fumarate-FA (PRENATAL VITAMIN PO) Take by mouth daily.     rosuvastatin  (CRESTOR ) 20 MG tablet TAKE 1 TABLET BY MOUTH EVERY DAY 90 tablet 2   triamcinolone  cream (KENALOG ) 0.1 % Apply 1 Application topically 2 (two) times daily as needed. 453.6 g 0   predniSONE  (DELTASONE ) 50 MG tablet Take one tablet daily with breakfast for the next 5 days. 5 tablet 0   predniSONE  (DELTASONE ) 50 MG tablet Take 1 tab daily with breakfast for 3 days 3 tablet 0   Current Facility-Administered Medications  Medication Dose Route Frequency Provider Last Rate Last Admin   0.9 %  sodium chloride  infusion  500 mL Intravenous Continuous Aneita Gwendlyn DASEN, MD         Past Medical History:  Diagnosis Date   Allergy    Anemia    GERD (gastroesophageal reflux disease)    occ   Palpitations     Wolff-Parkinson-White (WPW) syndrome     ROS:   All systems reviewed and negative except as noted in the HPI.   Past Surgical History:  Procedure Laterality Date   APPENDECTOMY  1988   LEEP     NSVD     x3   TUBAL LIGATION       Family History  Problem Relation Age of Onset   Hypertension Mother    Diabetes Mother    Aneurysm Mother    Cancer Mother    Colon cancer Maternal Grandmother    Heart murmur Daughter    Colon cancer Brother 84       dx'd 6 months ago as of 01-04-17    Liver cancer Brother        mets from colon    Lung cancer Brother        mets from colon    Thyroid cancer Maternal Grandfather    Colon cancer Other    Colon polyps Neg Hx    Esophageal cancer Neg Hx      Social History   Socioeconomic History   Marital status: Married    Spouse name: Not on file   Number of children: Not on file   Years of education: Not on file   Highest education  level: Not on file  Occupational History   Not on file  Tobacco Use   Smoking status: Never   Smokeless tobacco: Never  Vaping Use   Vaping status: Never Used  Substance and Sexual Activity   Alcohol use: Yes    Alcohol/week: 2.0 standard drinks of alcohol    Types: 2 Standard drinks or equivalent per week    Comment: socially   Drug use: No   Sexual activity: Yes    Partners: Male  Other Topics Concern   Not on file  Social History Narrative   Not on file   Social Drivers of Health   Financial Resource Strain: Not on file  Food Insecurity: Not on file  Transportation Needs: Not on file  Physical Activity: Not on file  Stress: Not on file  Social Connections: Not on file  Intimate Partner Violence: Not on file     BP (!) 142/80   Pulse 63   Ht 5' 6 (1.676 m)   Wt 269 lb 12.8 oz (122.4 kg)   LMP  (LMP Unknown)   SpO2 98%   BMI 43.55 kg/m   Physical Exam:  Well appearing NAD HEENT: Unremarkable Neck:  No JVD, no thyromegally Lymphatics:  No adenopathy Back:  No CVA  tenderness Lungs:  Clear with no wheezes HEART:  Regular rate rhythm, no murmurs, no rubs, no clicks Abd:  soft, positive bowel sounds, no organomegally, no rebound, no guarding Ext:  2 plus pulses, no edema, no cyanosis, no clubbing Skin:  No rashes no nodules Neuro:  CN II through XII intact, motor grossly intact  EKG - nsr with no pre-excitation  Assess/Plan:  SVT/WPW - her symptoms are well controlled and her ECG is reassuring. She will undergo watchful waiting and I asked her to take prn metoprolol  and a prescription has been given. Obesity - she is encouraged to lose weight.  Danelle Elizabella Nolet,MD

## 2023-11-26 NOTE — Patient Instructions (Signed)

## 2024-04-29 ENCOUNTER — Ambulatory Visit
Admission: EM | Admit: 2024-04-29 | Discharge: 2024-04-29 | Disposition: A | Discharge status: Home / Self Care | Source: Home / Self Care

## 2024-04-29 DIAGNOSIS — J4521 Mild intermittent asthma with (acute) exacerbation: Secondary | ICD-10-CM | POA: Diagnosis not present

## 2024-04-29 DIAGNOSIS — R051 Acute cough: Secondary | ICD-10-CM

## 2024-04-29 MED ORDER — PROMETHAZINE-DM 6.25-15 MG/5ML PO SYRP: 5.0000 mL | ORAL_SOLUTION | Freq: Four times a day (QID) | ORAL | 0 refills | Status: AC | PRN

## 2024-04-29 NOTE — ED Triage Notes (Signed)
 Pt states cough for the past 3 days. States she has been taking Nyquil at home.

## 2024-04-29 NOTE — ED Provider Notes (Signed)
 " [facility]    CSN: [id number] Arrival date & time: 04/29/24  1658      History   Chief Complaint Chief Complaint  Patient presents with   Cough    HPI Paige Oneill is a 58 y.o. female.   Patient presenting today with 3-day history of hacking cough, occasional wheezing resolved by her albuterol inhaler.  Denies chest pain, shortness of breath, congestion, sore throat, fever, chills, body aches.  Took a dose of NyQuil last night and has been using her albuterol as needed which she has for undiagnosed asthma.  States she tends to get bronchitis every year around this time.    Past Medical History:  Diagnosis Date   Allergy    Anemia    GERD (gastroesophageal reflux disease)    occ   Palpitations    Wolff-Parkinson-White (WPW) syndrome     Patient Active Problem List   Diagnosis Date Noted   SVT (supraventricular tachycardia) 07/12/2020   Chronic pain of left knee 03/27/2017   Plantar fasciitis of left foot 02/27/2017   Morbid obesity (HCC) 03/18/2013   WPW syndrome 11/11/2012   Dyspnea 11/11/2012   Palpitations 11/11/2012    Past Surgical History:  Procedure Laterality Date   APPENDECTOMY  1988   LEEP     NSVD     x3   TUBAL LIGATION      OB History   No obstetric history on file.      Home Medications    Prior to Admission medications  Medication Sig Start Date End Date Taking? Authorizing Provider  promethazine-dextromethorphan (PROMETHAZINE-DM) 6.25-15 MG/5ML syrup Take 5 mLs by mouth 4 (four) times daily as needed. 04/29/24  Yes Stuart Vernell Norris, PA-C  famotidine  (PEPCID ) 20 MG tablet Take 1 tablet (20 mg total) by mouth daily. 04/12/22   Leath-Warren, Etta PARAS, NP  fluticasone  (FLONASE ) 50 MCG/ACT nasal spray Place 1 spray into both nostrils 2 (two) times daily. 02/25/23   Stuart Vernell Norris, PA-C  metoprolol  tartrate (LOPRESSOR ) 25 MG tablet TAKE 1 TABLET BY MOUTH AS NEEDED. IN ONE HOUR, YOU MAY REPEAT THIS DOSE AS  NEEDED FOR PALPITATIONS. 05/25/22   Waddell Danelle ORN, MD  nitroGLYCERIN  (NITROSTAT ) 0.4 MG SL tablet Place 0.4 mg under the tongue every 5 (five) minutes as needed for chest pain.    [provider]  predniSONE  (DELTASONE ) 50 MG tablet Take one tablet daily with breakfast for the next 5 days. 04/12/22   Leath-Warren, Etta PARAS, NP  predniSONE  (DELTASONE ) 50 MG tablet Take 1 tab daily with breakfast for 3 days 02/25/23   Stuart Vernell Norris, PA-C  Prenatal Vit-Fe Fumarate-FA (PRENATAL VITAMIN PO) Take by mouth daily.    [provider]  rosuvastatin  (CRESTOR ) 20 MG tablet TAKE 1 TABLET BY MOUTH EVERY DAY 01/15/22   Ursuy, Renee Lynn, PA-C  triamcinolone  cream (KENALOG ) 0.1 % Apply 1 Application topically 2 (two) times daily as needed. 02/25/23   Stuart Vernell Norris, PA-C    Family History Family History  Problem Relation Age of Onset   Hypertension Mother    Diabetes Mother    Aneurysm Mother    Cancer Mother    Colon cancer Maternal Grandmother    Heart murmur Daughter    Colon cancer Brother 26       dx'd 6 months ago as of 01-04-17    Liver cancer Brother        mets from colon    Lung cancer Brother  mets from colon    Thyroid cancer Maternal Grandfather    Colon cancer Other    Colon polyps Neg Hx    Esophageal cancer Neg Hx     Social History Social History[1]   Allergies   Penicillins   Review of Systems Review of Systems PER HPI  Physical Exam Triage Vital Signs ED Triage Vitals  Encounter Vitals Group     BP 04/29/24 1715 (!) 159/86     Girls Systolic BP Percentile --      Girls Diastolic BP Percentile --      Boys Systolic BP Percentile --      Boys Diastolic BP Percentile --      Pulse Rate 04/29/24 1715 62     Resp 04/29/24 1715 16     Temp 04/29/24 1715 98.9 F (37.2 C)     Temp Source 04/29/24 1715 Oral     SpO2 04/29/24 1715 98 %     Weight --      Height --      Head Circumference --      Peak Flow --      Pain  Score 04/29/24 1713 0     Pain Loc --      Pain Education --      Exclude from Growth Chart --    No data found.  Updated Vital Signs BP (!) 159/86 (BP Location: Right Arm)   Pulse 62   Temp 98.9 F (37.2 C) (Oral)   Resp 16   LMP  (LMP Unknown)   SpO2 98%   Visual Acuity Right Eye Distance:   Left Eye Distance:   Bilateral Distance:    Right Eye Near:   Left Eye Near:    Bilateral Near:     Physical Exam Vitals and nursing note reviewed.  Constitutional:      Appearance: Normal appearance.  HENT:     Head: Atraumatic.     Right Ear: Tympanic membrane and external ear normal.     Left Ear: Tympanic membrane and external ear normal.     Nose: Nose normal.     Mouth/Throat:     Mouth: Mucous membranes are moist.     Pharynx: No posterior oropharyngeal erythema.  Eyes:     Extraocular Movements: Extraocular movements intact.     Conjunctiva/sclera: Conjunctivae normal.  Cardiovascular:     Rate and Rhythm: Normal rate.  Pulmonary:     Effort: Pulmonary effort is normal.     Breath sounds: Normal breath sounds. No wheezing or rales.  Musculoskeletal:        General: Normal range of motion.     Cervical back: Normal range of motion and neck supple.  Skin:    General: Skin is warm and dry.  Neurological:     Mental Status: She is alert and oriented to person, place, and time.  Psychiatric:        Mood and Affect: Mood normal.        Thought Content: Thought content normal.      UC Treatments / Results  Labs (all labs ordered are listed, but only abnormal results are displayed) Labs Reviewed - No data to display  EKG   Radiology No results found.  Procedures Procedures (including critical care time)  Medications Ordered in UC Medications - No data to display  Initial Impression / Assessment and Plan / UC Course  I have reviewed the triage vital signs and the nursing notes.  Pertinent labs &  imaging results that were available during my care of  the patient were reviewed by me and considered in my medical decision making (see chart for details).     Hypertensive in triage, otherwise vital signs reassuring.  Suspect viral versus allergic symptoms.  Will treat with Phenergan DM, over-the-counter cold and congestion medications, supportive home care, antihistamines.  Return for worsening or unresolving symptoms.  Final Clinical Impressions(s) / UC Diagnoses   Final diagnoses:  Acute cough  Mild intermittent asthma with acute exacerbation     Discharge Instructions      I suspect either viral illness or seasonal allergies to be causing her cough at this time.  Your vital signs and exam are very reassuring, your lungs sound nice and clear today.  We will treat with a cough syrup, continue your albuterol inhaler every 4 hours as needed, take over-the-counter cold and congestion medications such as Coricidin HBP, plain Mucinex, antihistamines, nasal sprays and drink plenty of fluids, get lots of rest.  Follow-up for worsening or unresolving symptoms.    ED Prescriptions     Medication Sig Dispense Auth. Provider   promethazine-dextromethorphan (PROMETHAZINE-DM) 6.25-15 MG/5ML syrup Take 5 mLs by mouth 4 (four) times daily as needed. 100 mL Stuart Vernell Norris, [place]      PDMP not reviewed this encounter.    [1]  Social History Tobacco Use   Smoking status: Never   Smokeless tobacco: Never  Vaping Use   Vaping status: Never Used  Substance Use Topics   Alcohol use: Yes    Alcohol/week: 2.0 standard drinks of alcohol    Types: 2 Standard drinks or equivalent per week    Comment: socially   Drug use: No     Stuart Vernell Norris, [place] 04/29/24 1832  "

## 2024-04-29 NOTE — Discharge Instructions (Signed)
 I suspect either viral illness or seasonal allergies to be causing her cough at this time.  Your vital signs and exam are very reassuring, your lungs sound nice and clear today.  We will treat with a cough syrup, continue your albuterol inhaler every 4 hours as needed, take over-the-counter cold and congestion medications such as Coricidin HBP, plain Mucinex, antihistamines, nasal sprays and drink plenty of fluids, get lots of rest.  Follow-up for worsening or unresolving symptoms.
